# Patient Record
Sex: Female | Born: 1950 | Race: White | Hispanic: No | Marital: Married | Smoking: Never smoker
Health system: Southern US, Community
[De-identification: ages and names within clinical notes are randomized; demographics above are authoritative.]

## PROBLEM LIST (undated history)

## (undated) DIAGNOSIS — M797 Fibromyalgia: Secondary | ICD-10-CM

## (undated) DIAGNOSIS — E785 Hyperlipidemia, unspecified: Secondary | ICD-10-CM

## (undated) DIAGNOSIS — E039 Hypothyroidism, unspecified: Secondary | ICD-10-CM

## (undated) DIAGNOSIS — K219 Gastro-esophageal reflux disease without esophagitis: Secondary | ICD-10-CM

## (undated) DIAGNOSIS — F411 Generalized anxiety disorder: Secondary | ICD-10-CM

## (undated) DIAGNOSIS — I10 Essential (primary) hypertension: Secondary | ICD-10-CM

## (undated) DIAGNOSIS — N183 Chronic kidney disease, stage 3 unspecified: Secondary | ICD-10-CM

## (undated) DIAGNOSIS — U099 Post covid-19 condition, unspecified: Secondary | ICD-10-CM

## (undated) HISTORY — DX: Chronic kidney disease, stage 3 unspecified: N18.30

## (undated) HISTORY — DX: Hypothyroidism, unspecified: E03.9

## (undated) HISTORY — DX: Hyperlipidemia, unspecified: E78.5

## (undated) HISTORY — DX: Essential (primary) hypertension: I10

## (undated) HISTORY — DX: Gastro-esophageal reflux disease without esophagitis: K21.9

## (undated) HISTORY — DX: Fibromyalgia: M79.7

## (undated) HISTORY — DX: Generalized anxiety disorder: F41.1

## (undated) HISTORY — PX: CATARACT EXTRACTION, BILATERAL: SHX1313

---

## 1998-05-09 HISTORY — PX: DILATION AND CURETTAGE OF UTERUS: SHX78

## 2014-02-05 ENCOUNTER — Telehealth: Payer: Self-pay | Admitting: *Deleted

## 2014-06-11 NOTE — Telephone Encounter (Signed)
Error

## 2017-10-11 DIAGNOSIS — E782 Mixed hyperlipidemia: Secondary | ICD-10-CM | POA: Diagnosis not present

## 2017-10-11 DIAGNOSIS — I10 Essential (primary) hypertension: Secondary | ICD-10-CM | POA: Diagnosis not present

## 2017-10-17 DIAGNOSIS — E782 Mixed hyperlipidemia: Secondary | ICD-10-CM | POA: Diagnosis not present

## 2017-10-17 DIAGNOSIS — Z1331 Encounter for screening for depression: Secondary | ICD-10-CM | POA: Diagnosis not present

## 2017-10-17 DIAGNOSIS — M549 Dorsalgia, unspecified: Secondary | ICD-10-CM | POA: Diagnosis not present

## 2017-10-17 DIAGNOSIS — I10 Essential (primary) hypertension: Secondary | ICD-10-CM | POA: Diagnosis not present

## 2017-10-17 DIAGNOSIS — N183 Chronic kidney disease, stage 3 (moderate): Secondary | ICD-10-CM | POA: Diagnosis not present

## 2017-10-17 DIAGNOSIS — Z1389 Encounter for screening for other disorder: Secondary | ICD-10-CM | POA: Diagnosis not present

## 2017-10-17 DIAGNOSIS — S29019A Strain of muscle and tendon of unspecified wall of thorax, initial encounter: Secondary | ICD-10-CM | POA: Diagnosis not present

## 2017-10-17 DIAGNOSIS — Z6836 Body mass index (BMI) 36.0-36.9, adult: Secondary | ICD-10-CM | POA: Diagnosis not present

## 2017-11-07 DIAGNOSIS — Z780 Asymptomatic menopausal state: Secondary | ICD-10-CM | POA: Diagnosis not present

## 2017-11-07 DIAGNOSIS — M810 Age-related osteoporosis without current pathological fracture: Secondary | ICD-10-CM | POA: Diagnosis not present

## 2017-11-07 DIAGNOSIS — M8588 Other specified disorders of bone density and structure, other site: Secondary | ICD-10-CM | POA: Diagnosis not present

## 2017-11-07 DIAGNOSIS — M4850XA Collapsed vertebra, not elsewhere classified, site unspecified, initial encounter for fracture: Secondary | ICD-10-CM | POA: Diagnosis not present

## 2018-03-13 DIAGNOSIS — H35033 Hypertensive retinopathy, bilateral: Secondary | ICD-10-CM | POA: Diagnosis not present

## 2018-03-19 DIAGNOSIS — J0101 Acute recurrent maxillary sinusitis: Secondary | ICD-10-CM | POA: Diagnosis not present

## 2018-03-19 DIAGNOSIS — R05 Cough: Secondary | ICD-10-CM | POA: Diagnosis not present

## 2018-03-19 DIAGNOSIS — Z6837 Body mass index (BMI) 37.0-37.9, adult: Secondary | ICD-10-CM | POA: Diagnosis not present

## 2018-04-11 DIAGNOSIS — E781 Pure hyperglyceridemia: Secondary | ICD-10-CM | POA: Diagnosis not present

## 2018-04-11 DIAGNOSIS — I10 Essential (primary) hypertension: Secondary | ICD-10-CM | POA: Diagnosis not present

## 2018-04-11 DIAGNOSIS — Z23 Encounter for immunization: Secondary | ICD-10-CM | POA: Diagnosis not present

## 2018-04-11 DIAGNOSIS — M797 Fibromyalgia: Secondary | ICD-10-CM | POA: Diagnosis not present

## 2018-04-11 DIAGNOSIS — E782 Mixed hyperlipidemia: Secondary | ICD-10-CM | POA: Diagnosis not present

## 2018-04-11 DIAGNOSIS — N183 Chronic kidney disease, stage 3 (moderate): Secondary | ICD-10-CM | POA: Diagnosis not present

## 2018-04-11 DIAGNOSIS — K210 Gastro-esophageal reflux disease with esophagitis: Secondary | ICD-10-CM | POA: Diagnosis not present

## 2018-04-17 DIAGNOSIS — S22000D Wedge compression fracture of unspecified thoracic vertebra, subsequent encounter for fracture with routine healing: Secondary | ICD-10-CM | POA: Diagnosis not present

## 2018-04-17 DIAGNOSIS — J069 Acute upper respiratory infection, unspecified: Secondary | ICD-10-CM | POA: Diagnosis not present

## 2018-04-17 DIAGNOSIS — Z6836 Body mass index (BMI) 36.0-36.9, adult: Secondary | ICD-10-CM | POA: Diagnosis not present

## 2018-04-17 DIAGNOSIS — I10 Essential (primary) hypertension: Secondary | ICD-10-CM | POA: Diagnosis not present

## 2018-04-17 DIAGNOSIS — K210 Gastro-esophageal reflux disease with esophagitis: Secondary | ICD-10-CM | POA: Diagnosis not present

## 2018-04-17 DIAGNOSIS — E782 Mixed hyperlipidemia: Secondary | ICD-10-CM | POA: Diagnosis not present

## 2018-04-17 DIAGNOSIS — Z23 Encounter for immunization: Secondary | ICD-10-CM | POA: Diagnosis not present

## 2018-04-17 DIAGNOSIS — N183 Chronic kidney disease, stage 3 (moderate): Secondary | ICD-10-CM | POA: Diagnosis not present

## 2018-07-16 DIAGNOSIS — H25013 Cortical age-related cataract, bilateral: Secondary | ICD-10-CM | POA: Diagnosis not present

## 2018-07-16 DIAGNOSIS — H25042 Posterior subcapsular polar age-related cataract, left eye: Secondary | ICD-10-CM | POA: Diagnosis not present

## 2018-07-16 DIAGNOSIS — H2512 Age-related nuclear cataract, left eye: Secondary | ICD-10-CM | POA: Diagnosis not present

## 2018-07-16 DIAGNOSIS — H35033 Hypertensive retinopathy, bilateral: Secondary | ICD-10-CM | POA: Diagnosis not present

## 2018-07-16 DIAGNOSIS — H25012 Cortical age-related cataract, left eye: Secondary | ICD-10-CM | POA: Diagnosis not present

## 2018-07-16 DIAGNOSIS — H2513 Age-related nuclear cataract, bilateral: Secondary | ICD-10-CM | POA: Diagnosis not present

## 2018-07-16 DIAGNOSIS — H25043 Posterior subcapsular polar age-related cataract, bilateral: Secondary | ICD-10-CM | POA: Diagnosis not present

## 2018-07-20 DIAGNOSIS — N183 Chronic kidney disease, stage 3 (moderate): Secondary | ICD-10-CM | POA: Diagnosis not present

## 2018-07-20 DIAGNOSIS — Z6838 Body mass index (BMI) 38.0-38.9, adult: Secondary | ICD-10-CM | POA: Diagnosis not present

## 2018-07-20 DIAGNOSIS — M797 Fibromyalgia: Secondary | ICD-10-CM | POA: Diagnosis not present

## 2018-07-20 DIAGNOSIS — I10 Essential (primary) hypertension: Secondary | ICD-10-CM | POA: Diagnosis not present

## 2018-07-20 DIAGNOSIS — E782 Mixed hyperlipidemia: Secondary | ICD-10-CM | POA: Diagnosis not present

## 2018-07-24 DIAGNOSIS — H25812 Combined forms of age-related cataract, left eye: Secondary | ICD-10-CM | POA: Diagnosis not present

## 2018-07-24 DIAGNOSIS — H2512 Age-related nuclear cataract, left eye: Secondary | ICD-10-CM | POA: Diagnosis not present

## 2018-08-02 DIAGNOSIS — H2512 Age-related nuclear cataract, left eye: Secondary | ICD-10-CM | POA: Diagnosis not present

## 2018-08-22 DIAGNOSIS — H2511 Age-related nuclear cataract, right eye: Secondary | ICD-10-CM | POA: Diagnosis not present

## 2018-08-22 DIAGNOSIS — H25041 Posterior subcapsular polar age-related cataract, right eye: Secondary | ICD-10-CM | POA: Diagnosis not present

## 2018-08-22 DIAGNOSIS — H25011 Cortical age-related cataract, right eye: Secondary | ICD-10-CM | POA: Diagnosis not present

## 2018-09-25 DIAGNOSIS — H25011 Cortical age-related cataract, right eye: Secondary | ICD-10-CM | POA: Diagnosis not present

## 2018-09-25 DIAGNOSIS — H2511 Age-related nuclear cataract, right eye: Secondary | ICD-10-CM | POA: Diagnosis not present

## 2018-09-25 DIAGNOSIS — H25041 Posterior subcapsular polar age-related cataract, right eye: Secondary | ICD-10-CM | POA: Diagnosis not present

## 2018-10-08 DIAGNOSIS — E782 Mixed hyperlipidemia: Secondary | ICD-10-CM | POA: Diagnosis not present

## 2018-10-08 DIAGNOSIS — R5383 Other fatigue: Secondary | ICD-10-CM | POA: Diagnosis not present

## 2018-10-08 DIAGNOSIS — N183 Chronic kidney disease, stage 3 (moderate): Secondary | ICD-10-CM | POA: Diagnosis not present

## 2018-10-08 DIAGNOSIS — I10 Essential (primary) hypertension: Secondary | ICD-10-CM | POA: Diagnosis not present

## 2018-10-08 DIAGNOSIS — K210 Gastro-esophageal reflux disease with esophagitis: Secondary | ICD-10-CM | POA: Diagnosis not present

## 2018-10-08 DIAGNOSIS — E781 Pure hyperglyceridemia: Secondary | ICD-10-CM | POA: Diagnosis not present

## 2018-10-17 DIAGNOSIS — M797 Fibromyalgia: Secondary | ICD-10-CM | POA: Diagnosis not present

## 2018-10-17 DIAGNOSIS — E782 Mixed hyperlipidemia: Secondary | ICD-10-CM | POA: Diagnosis not present

## 2018-10-17 DIAGNOSIS — I10 Essential (primary) hypertension: Secondary | ICD-10-CM | POA: Diagnosis not present

## 2018-10-17 DIAGNOSIS — G44209 Tension-type headache, unspecified, not intractable: Secondary | ICD-10-CM | POA: Diagnosis not present

## 2018-10-17 DIAGNOSIS — S22000D Wedge compression fracture of unspecified thoracic vertebra, subsequent encounter for fracture with routine healing: Secondary | ICD-10-CM | POA: Diagnosis not present

## 2018-10-17 DIAGNOSIS — N183 Chronic kidney disease, stage 3 (moderate): Secondary | ICD-10-CM | POA: Diagnosis not present

## 2018-10-17 DIAGNOSIS — Z6838 Body mass index (BMI) 38.0-38.9, adult: Secondary | ICD-10-CM | POA: Diagnosis not present

## 2018-10-17 DIAGNOSIS — Z0001 Encounter for general adult medical examination with abnormal findings: Secondary | ICD-10-CM | POA: Diagnosis not present

## 2019-02-11 DIAGNOSIS — Z23 Encounter for immunization: Secondary | ICD-10-CM | POA: Diagnosis not present

## 2019-04-11 DIAGNOSIS — I10 Essential (primary) hypertension: Secondary | ICD-10-CM | POA: Diagnosis not present

## 2019-04-11 DIAGNOSIS — E781 Pure hyperglyceridemia: Secondary | ICD-10-CM | POA: Diagnosis not present

## 2019-04-11 DIAGNOSIS — E782 Mixed hyperlipidemia: Secondary | ICD-10-CM | POA: Diagnosis not present

## 2019-04-11 DIAGNOSIS — R5383 Other fatigue: Secondary | ICD-10-CM | POA: Diagnosis not present

## 2019-04-17 DIAGNOSIS — N183 Chronic kidney disease, stage 3 unspecified: Secondary | ICD-10-CM | POA: Diagnosis not present

## 2019-04-17 DIAGNOSIS — E039 Hypothyroidism, unspecified: Secondary | ICD-10-CM | POA: Diagnosis not present

## 2019-04-17 DIAGNOSIS — Z6838 Body mass index (BMI) 38.0-38.9, adult: Secondary | ICD-10-CM | POA: Diagnosis not present

## 2019-04-17 DIAGNOSIS — F411 Generalized anxiety disorder: Secondary | ICD-10-CM | POA: Diagnosis not present

## 2019-04-17 DIAGNOSIS — Z23 Encounter for immunization: Secondary | ICD-10-CM | POA: Diagnosis not present

## 2019-04-17 DIAGNOSIS — I10 Essential (primary) hypertension: Secondary | ICD-10-CM | POA: Diagnosis not present

## 2019-04-17 DIAGNOSIS — G44209 Tension-type headache, unspecified, not intractable: Secondary | ICD-10-CM | POA: Diagnosis not present

## 2019-04-17 DIAGNOSIS — S22000D Wedge compression fracture of unspecified thoracic vertebra, subsequent encounter for fracture with routine healing: Secondary | ICD-10-CM | POA: Diagnosis not present

## 2019-05-07 DIAGNOSIS — U071 COVID-19: Secondary | ICD-10-CM | POA: Diagnosis not present

## 2019-07-31 DIAGNOSIS — Z23 Encounter for immunization: Secondary | ICD-10-CM | POA: Diagnosis not present

## 2019-08-28 DIAGNOSIS — Z23 Encounter for immunization: Secondary | ICD-10-CM | POA: Diagnosis not present

## 2019-10-08 DIAGNOSIS — E039 Hypothyroidism, unspecified: Secondary | ICD-10-CM | POA: Diagnosis not present

## 2019-10-08 DIAGNOSIS — E7800 Pure hypercholesterolemia, unspecified: Secondary | ICD-10-CM | POA: Diagnosis not present

## 2019-10-08 DIAGNOSIS — K2100 Gastro-esophageal reflux disease with esophagitis, without bleeding: Secondary | ICD-10-CM | POA: Diagnosis not present

## 2019-10-08 DIAGNOSIS — N183 Chronic kidney disease, stage 3 unspecified: Secondary | ICD-10-CM | POA: Diagnosis not present

## 2019-10-08 DIAGNOSIS — I10 Essential (primary) hypertension: Secondary | ICD-10-CM | POA: Diagnosis not present

## 2019-10-10 DIAGNOSIS — E782 Mixed hyperlipidemia: Secondary | ICD-10-CM | POA: Diagnosis not present

## 2019-10-10 DIAGNOSIS — E039 Hypothyroidism, unspecified: Secondary | ICD-10-CM | POA: Diagnosis not present

## 2019-10-10 DIAGNOSIS — Z0001 Encounter for general adult medical examination with abnormal findings: Secondary | ICD-10-CM | POA: Diagnosis not present

## 2019-10-10 DIAGNOSIS — M797 Fibromyalgia: Secondary | ICD-10-CM | POA: Diagnosis not present

## 2019-10-10 DIAGNOSIS — N183 Chronic kidney disease, stage 3 unspecified: Secondary | ICD-10-CM | POA: Diagnosis not present

## 2019-10-10 DIAGNOSIS — I10 Essential (primary) hypertension: Secondary | ICD-10-CM | POA: Diagnosis not present

## 2019-10-15 DIAGNOSIS — R230 Cyanosis: Secondary | ICD-10-CM | POA: Diagnosis not present

## 2019-10-15 DIAGNOSIS — I10 Essential (primary) hypertension: Secondary | ICD-10-CM | POA: Diagnosis not present

## 2019-10-15 DIAGNOSIS — I517 Cardiomegaly: Secondary | ICD-10-CM | POA: Diagnosis not present

## 2019-10-15 DIAGNOSIS — G44209 Tension-type headache, unspecified, not intractable: Secondary | ICD-10-CM | POA: Diagnosis not present

## 2019-10-15 DIAGNOSIS — E782 Mixed hyperlipidemia: Secondary | ICD-10-CM | POA: Diagnosis not present

## 2019-10-15 DIAGNOSIS — S22000D Wedge compression fracture of unspecified thoracic vertebra, subsequent encounter for fracture with routine healing: Secondary | ICD-10-CM | POA: Diagnosis not present

## 2019-10-15 DIAGNOSIS — Z6837 Body mass index (BMI) 37.0-37.9, adult: Secondary | ICD-10-CM | POA: Diagnosis not present

## 2019-10-18 DIAGNOSIS — Z1231 Encounter for screening mammogram for malignant neoplasm of breast: Secondary | ICD-10-CM | POA: Diagnosis not present

## 2019-11-15 ENCOUNTER — Ambulatory Visit: Payer: Self-pay | Admitting: Cardiology

## 2019-11-20 ENCOUNTER — Encounter: Payer: Self-pay | Admitting: *Deleted

## 2019-12-11 ENCOUNTER — Ambulatory Visit: Payer: Self-pay | Admitting: Cardiology

## 2020-02-25 DIAGNOSIS — H35033 Hypertensive retinopathy, bilateral: Secondary | ICD-10-CM | POA: Diagnosis not present

## 2020-02-25 DIAGNOSIS — Z23 Encounter for immunization: Secondary | ICD-10-CM | POA: Diagnosis not present

## 2020-03-04 DIAGNOSIS — J40 Bronchitis, not specified as acute or chronic: Secondary | ICD-10-CM | POA: Diagnosis not present

## 2020-03-04 DIAGNOSIS — Z20828 Contact with and (suspected) exposure to other viral communicable diseases: Secondary | ICD-10-CM | POA: Diagnosis not present

## 2020-03-04 DIAGNOSIS — J0101 Acute recurrent maxillary sinusitis: Secondary | ICD-10-CM | POA: Diagnosis not present

## 2020-03-04 DIAGNOSIS — J069 Acute upper respiratory infection, unspecified: Secondary | ICD-10-CM | POA: Diagnosis not present

## 2020-03-23 DIAGNOSIS — H04123 Dry eye syndrome of bilateral lacrimal glands: Secondary | ICD-10-CM | POA: Diagnosis not present

## 2020-03-23 DIAGNOSIS — Z961 Presence of intraocular lens: Secondary | ICD-10-CM | POA: Diagnosis not present

## 2020-03-23 DIAGNOSIS — H26492 Other secondary cataract, left eye: Secondary | ICD-10-CM | POA: Diagnosis not present

## 2020-03-23 DIAGNOSIS — H35033 Hypertensive retinopathy, bilateral: Secondary | ICD-10-CM | POA: Diagnosis not present

## 2020-03-23 DIAGNOSIS — H26493 Other secondary cataract, bilateral: Secondary | ICD-10-CM | POA: Diagnosis not present

## 2020-04-07 DIAGNOSIS — E039 Hypothyroidism, unspecified: Secondary | ICD-10-CM | POA: Diagnosis not present

## 2020-04-07 DIAGNOSIS — K2100 Gastro-esophageal reflux disease with esophagitis, without bleeding: Secondary | ICD-10-CM | POA: Diagnosis not present

## 2020-04-07 DIAGNOSIS — E7800 Pure hypercholesterolemia, unspecified: Secondary | ICD-10-CM | POA: Diagnosis not present

## 2020-04-07 DIAGNOSIS — N183 Chronic kidney disease, stage 3 unspecified: Secondary | ICD-10-CM | POA: Diagnosis not present

## 2020-04-07 DIAGNOSIS — I10 Essential (primary) hypertension: Secondary | ICD-10-CM | POA: Diagnosis not present

## 2020-04-07 DIAGNOSIS — E782 Mixed hyperlipidemia: Secondary | ICD-10-CM | POA: Diagnosis not present

## 2020-04-14 DIAGNOSIS — S22000D Wedge compression fracture of unspecified thoracic vertebra, subsequent encounter for fracture with routine healing: Secondary | ICD-10-CM | POA: Diagnosis not present

## 2020-04-14 DIAGNOSIS — R230 Cyanosis: Secondary | ICD-10-CM | POA: Diagnosis not present

## 2020-04-14 DIAGNOSIS — Z6836 Body mass index (BMI) 36.0-36.9, adult: Secondary | ICD-10-CM | POA: Diagnosis not present

## 2020-04-14 DIAGNOSIS — G44209 Tension-type headache, unspecified, not intractable: Secondary | ICD-10-CM | POA: Diagnosis not present

## 2020-04-14 DIAGNOSIS — I10 Essential (primary) hypertension: Secondary | ICD-10-CM | POA: Diagnosis not present

## 2020-04-14 DIAGNOSIS — E782 Mixed hyperlipidemia: Secondary | ICD-10-CM | POA: Diagnosis not present

## 2020-04-14 DIAGNOSIS — M810 Age-related osteoporosis without current pathological fracture: Secondary | ICD-10-CM | POA: Diagnosis not present

## 2020-04-20 DIAGNOSIS — H26491 Other secondary cataract, right eye: Secondary | ICD-10-CM | POA: Diagnosis not present

## 2020-04-21 DIAGNOSIS — Z23 Encounter for immunization: Secondary | ICD-10-CM | POA: Diagnosis not present

## 2020-05-20 DIAGNOSIS — N183 Chronic kidney disease, stage 3 unspecified: Secondary | ICD-10-CM | POA: Diagnosis not present

## 2020-05-20 DIAGNOSIS — Z6836 Body mass index (BMI) 36.0-36.9, adult: Secondary | ICD-10-CM | POA: Diagnosis not present

## 2020-05-20 DIAGNOSIS — R5383 Other fatigue: Secondary | ICD-10-CM | POA: Diagnosis not present

## 2020-05-20 DIAGNOSIS — I10 Essential (primary) hypertension: Secondary | ICD-10-CM | POA: Diagnosis not present

## 2020-06-22 ENCOUNTER — Ambulatory Visit (INDEPENDENT_AMBULATORY_CARE_PROVIDER_SITE_OTHER): Payer: Medicare Other | Admitting: Cardiology

## 2020-06-22 ENCOUNTER — Encounter: Payer: Self-pay | Admitting: Cardiology

## 2020-06-22 ENCOUNTER — Encounter: Payer: Self-pay | Admitting: *Deleted

## 2020-06-22 VITALS — BP 122/92 | HR 75 | Ht 60.0 in | Wt 197.0 lb

## 2020-06-22 DIAGNOSIS — E782 Mixed hyperlipidemia: Secondary | ICD-10-CM | POA: Diagnosis not present

## 2020-06-22 DIAGNOSIS — Z8616 Personal history of COVID-19: Secondary | ICD-10-CM | POA: Diagnosis not present

## 2020-06-22 DIAGNOSIS — R0602 Shortness of breath: Secondary | ICD-10-CM | POA: Diagnosis not present

## 2020-06-22 DIAGNOSIS — R9431 Abnormal electrocardiogram [ECG] [EKG]: Secondary | ICD-10-CM

## 2020-06-22 DIAGNOSIS — I10 Essential (primary) hypertension: Secondary | ICD-10-CM

## 2020-06-22 DIAGNOSIS — R0609 Other forms of dyspnea: Secondary | ICD-10-CM

## 2020-06-22 DIAGNOSIS — R0600 Dyspnea, unspecified: Secondary | ICD-10-CM

## 2020-06-22 NOTE — Progress Notes (Signed)
Cardiology Office Note  Date: 06/22/2020   ID: Megan, Mcmahon 1951/03/16, MRN [id number]  PCP:  Estanislado Pandy, MD  Cardiologist:  Nona Dell, MD Electrophysiologist:  None   Chief Complaint  Patient presents with  . Shortness of Breath    History of Present Illness: Megan Mcmahon is a 70 y.o. female referred for cardiology consultation by Dr. Neita Carp for the evaluation of shortness of breath.  She is here today with her husband.  She tells me that she had COVID-19 in December 2020 (tested positive) and most likely also again in December 2021 (did not take a test at that time).  Most recent event included 2 weeks of shortness of breath and cough, malaise, 15 pound weight loss, fingertips turning "blue."  She was not hospitalized.  Her husband states that he did not get sick.  Since that event she has continued to be short of breath with activity.  Also lack of energy.  I reviewed her most recent lab work per PCP from November 2021.  She has not had a recent chest x-ray.  I reviewed her medications which are listed below.  She did apparently have a spike in blood pressure during the most recent illness in December 2021, took an extra half Toprol-XL during that time.  Blood pressure has come back down.  I personally reviewed her ECG today which shows normal sinus rhythm with rightward axis, septal Q waves.  She has not undergone any recent cardiac structural or ischemic testing, but does recall evaluation several years ago with [facility]. She has no personal history of type 2 diabetes mellitus, does take medication for both hypertension and hyperlipidemia.  Past Medical History:  Diagnosis Date  . Chronic kidney disease, stage 3 (HCC)   . Fibromyalgia   . GAD (generalized anxiety disorder)   . GERD (gastroesophageal reflux disease)   . Hyperlipidemia   . Hypertension   . Hypothyroidism     Past Surgical History:  Procedure Laterality Date  . CATARACT  EXTRACTION, BILATERAL    . DILATION AND CURETTAGE OF UTERUS  2000    Current Outpatient Medications  Medication Sig Dispense Refill  . alendronate (FOSAMAX) 70 MG tablet Take 70 mg by mouth once a week. Take with a full glass of water on an empty stomach.    . ALPRAZolam (XANAX) 0.5 MG tablet Take 0.5 mg by mouth 3 (three) times daily as needed for anxiety.    Marland Kitchen amitriptyline (ELAVIL) 50 MG tablet Take 50 mg by mouth at bedtime as needed for sleep.    . DULoxetine (CYMBALTA) 60 MG capsule Take 60 mg by mouth daily.    Marland Kitchen escitalopram (LEXAPRO) 10 MG tablet Take 10 mg by mouth daily.    . hydrochlorothiazide (HYDRODIURIL) 25 MG tablet Take 25 mg by mouth daily.    Marland Kitchen levothyroxine (SYNTHROID) 50 MCG tablet Take 50 mcg by mouth daily before breakfast.    . metoprolol succinate (TOPROL-XL) 50 MG 24 hr tablet Take 75 mg by mouth daily. Take with or immediately following a meal.    . pravastatin (PRAVACHOL) 20 MG tablet Take 20 mg by mouth daily.    . pregabalin (LYRICA) 75 MG capsule Take 75 mg by mouth 2 (two) times daily.    Marland Kitchen tolterodine (DETROL LA) 4 MG 24 hr capsule Take 4 mg by mouth daily.     No current facility-administered medications for this visit.   Allergies:  Codeine   Social History:  The patient  reports that she has never smoked. She has never used smokeless tobacco. She reports that she does not drink alcohol and does not use drugs.   Family History: The patient's family history includes Heart attack in her father; Stroke in her mother.   ROS: No palpitations or syncope.  Physical Exam: VS:  BP (!) 122/92   Pulse 75   Ht 5' (1.524 m)   Wt 197 lb (89.4 kg)   BMI 38.47 kg/m , BMI Body mass index is 38.47 kg/m.  Wt Readings from Last 3 Encounters:  06/22/20 197 lb (89.4 kg)    General: Patient appears comfortable at rest. HEENT: Conjunctiva and lids normal, wearing a mask. Neck: Supple, no elevated JVP or carotid bruits, no thyromegaly. Lungs: Anterior crackles, no  wheezes or labored breathing at rest. Cardiac: Regular rate and rhythm, no S3, soft systolic murmur, no pericardial rub. Abdomen: Soft, nontender, bowel sounds present. Extremities: No pitting edema, distal pulses 2+. Skin: Warm and dry. Musculoskeletal: No kyphosis. Neuropsychiatric: Alert and oriented x3, affect grossly appropriate.  ECG:  No old tracings for review today.  Recent Labwork:  November 2021: BUN 8, creatinine 0.93, potassium 4.2, AST 18, ALT 11, cholesterol 175, TG 273, HDL 37, LDL 92, TSH 3.8  Other Studies Reviewed Today:  No prior cardiac testing for review today.  Assessment and Plan:  1.  Dyspnea on exertion as discussed above in a 70 year old woman with hypertension and hyperlipidemia.  ECG with nonspecific abnormalities, no known history of ischemic heart disease or cardiomyopathy.  She had suspected COVID-19 in December 2020 as well as December 2021.  No recent chest imaging.  I did review her lab work from November 2021.  Plan is to obtain a PA and lateral chest x-ray, also echocardiogram for cardiac structural assessment, and Lexiscan Myoview for ischemic evaluation.  2.  Essential hypertension, currently on Toprol-XL and HCTZ.  She follows with Dr. Neita Carp.  3.  Mixed hyperlipidemia, on Pravachol.  Most recent LDL 92.  Medication Adjustments/Labs and Tests Ordered: Current medicines are reviewed at length with the patient today.  Concerns regarding medicines are outlined above.   Tests Ordered: Orders Placed This Encounter  Procedures  . DG Chest 2 View  . NM Myocar Multi W/Spect W/Wall Motion / EF  . EKG 12-Lead  . ECHOCARDIOGRAM COMPLETE    Medication Changes: No orders of the defined types were placed in this encounter.   Disposition:  Follow up test results.  Signed, Jonelle Sidle, MD, [facility] 06/22/2020 3:29 PM    [facility] at [facility] [address] [place], [place], [place] [address] Phone: [phone]; Fax: [phone]

## 2020-06-22 NOTE — Patient Instructions (Signed)
Your physician recommends that you schedule a follow-up appointment in: PENDING WITH DR MCDOWELL  Your physician recommends that you continue on your current medications as directed. Please refer to the Current Medication list given to you today.  A chest x-ray takes a picture of the organs and structures inside the chest, including the heart, lungs, and blood vessels. This test can show several things, including, whether the heart is enlarges; whether fluid is building up in the lungs; and whether pacemaker / defibrillator leads are still in place.  Your physician has requested that you have an echocardiogram. Echocardiography is a painless test that uses sound waves to create images of your heart. It provides your doctor with information about the size and shape of your heart and how well your heart's chambers and valves are working. This procedure takes approximately one hour. There are no restrictions for this procedure.  Your physician has requested that you have a lexiscan myoview. For further information please visit [email or web address] Please follow instruction sheet, as given.  Thank you for choosing [facility]!!

## 2020-06-24 ENCOUNTER — Ambulatory Visit (HOSPITAL_COMMUNITY)
Admission: RE | Admit: 2020-06-24 | Discharge: 2020-06-24 | Disposition: A | Discharge status: Home / Self Care | Payer: Medicare Other | Source: Ambulatory Visit | Attending: Cardiology | Admitting: Cardiology

## 2020-06-24 ENCOUNTER — Other Ambulatory Visit: Payer: Self-pay

## 2020-06-24 DIAGNOSIS — R0602 Shortness of breath: Secondary | ICD-10-CM | POA: Diagnosis not present

## 2020-06-24 DIAGNOSIS — R0600 Dyspnea, unspecified: Secondary | ICD-10-CM | POA: Diagnosis not present

## 2020-07-01 ENCOUNTER — Telehealth: Payer: Self-pay | Admitting: *Deleted

## 2020-07-01 DIAGNOSIS — R0602 Shortness of breath: Secondary | ICD-10-CM

## 2020-07-01 NOTE — Telephone Encounter (Signed)
-----   Message from Jonelle Sidle, MD sent at 06/29/2020  4:28 PM EST ----- Results reviewed.  Chest x-ray does show abnormalities in the mid to lower lung zones, possibly consistent with resolving infection from COVID-19 in December, however fibrotic changes are also possible. Please refer to [facility] for further evaluation of lung findings.  She has had no recent imaging through her PCP.

## 2020-07-01 NOTE — Telephone Encounter (Signed)
Patient informed and verbalized understanding of plan. Copy sent to PCP 

## 2020-07-02 ENCOUNTER — Other Ambulatory Visit: Payer: Self-pay

## 2020-07-02 ENCOUNTER — Encounter (HOSPITAL_COMMUNITY): Payer: Self-pay

## 2020-07-02 ENCOUNTER — Encounter (HOSPITAL_BASED_OUTPATIENT_CLINIC_OR_DEPARTMENT_OTHER)
Admission: RE | Admit: 2020-07-02 | Discharge: 2020-07-02 | Disposition: A | Discharge status: Home / Self Care | Payer: Medicare Other | Source: Ambulatory Visit | Attending: Cardiology | Admitting: Cardiology

## 2020-07-02 ENCOUNTER — Ambulatory Visit (HOSPITAL_COMMUNITY)
Admission: RE | Admit: 2020-07-02 | Discharge: 2020-07-02 | Disposition: A | Discharge status: Home / Self Care | Payer: Medicare Other | Source: Ambulatory Visit | Attending: Cardiology | Admitting: Cardiology

## 2020-07-02 ENCOUNTER — Telehealth: Payer: Self-pay | Admitting: *Deleted

## 2020-07-02 ENCOUNTER — Encounter (HOSPITAL_COMMUNITY)
Admission: RE | Admit: 2020-07-02 | Discharge: 2020-07-02 | Disposition: A | Discharge status: Home / Self Care | Payer: Medicare Other | Source: Ambulatory Visit | Attending: Cardiology | Admitting: Cardiology

## 2020-07-02 DIAGNOSIS — R0602 Shortness of breath: Secondary | ICD-10-CM | POA: Diagnosis not present

## 2020-07-02 DIAGNOSIS — R0609 Other forms of dyspnea: Secondary | ICD-10-CM

## 2020-07-02 DIAGNOSIS — R0600 Dyspnea, unspecified: Secondary | ICD-10-CM | POA: Diagnosis not present

## 2020-07-02 LAB — ECHOCARDIOGRAM COMPLETE
Area-P 1/2: 3.25 cm2
S' Lateral: 2.4 cm

## 2020-07-02 LAB — NM MYOCAR MULTI W/SPECT W/WALL MOTION / EF
LV dias vol: 63 mL (ref 46–106)
LV sys vol: 16 mL
Peak HR: 91 {beats}/min
RATE: 0.58
Rest HR: 82 {beats}/min
SDS: 2
SRS: 5
SSS: 7
TID: 1.43

## 2020-07-02 MED ORDER — REGADENOSON 0.4 MG/5ML IV SOLN
INTRAVENOUS | Status: AC
  Administered 2020-07-02: 0.4 mg via INTRAVENOUS
  Filled 2020-07-02: qty 5

## 2020-07-02 MED ORDER — TECHNETIUM TC 99M TETROFOSMIN IV KIT
30.0000 | PACK | Freq: Once | INTRAVENOUS | Status: AC | PRN
  Administered 2020-07-02: 30.3 via INTRAVENOUS

## 2020-07-02 MED ORDER — TECHNETIUM TC 99M TETROFOSMIN IV KIT
10.0000 | PACK | Freq: Once | INTRAVENOUS | Status: AC | PRN
  Administered 2020-07-02: 9.9 via INTRAVENOUS

## 2020-07-02 MED ORDER — SODIUM CHLORIDE FLUSH 0.9 % IV SOLN
INTRAVENOUS | Status: AC
  Administered 2020-07-02: 10 mL via INTRAVENOUS
  Filled 2020-07-02: qty 10

## 2020-07-02 NOTE — Telephone Encounter (Signed)
Pt voiced understanding

## 2020-07-02 NOTE — Telephone Encounter (Signed)
-----   Message from Jonelle Sidle, MD sent at 07/02/2020 12:39 PM EST ----- Results reviewed.  Echocardiogram shows vigorous LVEF at 65 to 70%, also normal RV contraction.  No major valvular abnormalities that would explain symptoms.

## 2020-07-02 NOTE — Progress Notes (Signed)
*  PRELIMINARY RESULTS* Echocardiogram 2D Echocardiogram has been performed.  Stacey Drain 07/02/2020, 11:24 AM

## 2020-07-08 ENCOUNTER — Telehealth: Payer: Self-pay | Admitting: Cardiology

## 2020-07-08 ENCOUNTER — Telehealth: Payer: Self-pay | Admitting: *Deleted

## 2020-07-08 NOTE — Telephone Encounter (Signed)
-----   Message from Jonelle Sidle, MD sent at 07/02/2020  4:18 PM EST ----- Results reviewed.  Stress test overall low risk, vigorous LVEF and no obvious ischemic territories to suggest obstructive CAD as cause of symptoms.  Keep plans for follow-up with Pulmonary.  No further cardiac testing at this time.

## 2020-07-08 NOTE — Telephone Encounter (Signed)
Advised that her f/u is pending test results and once all test have been reviewed by her provider-it will be determined at that time about her follow up. Verbalized understanding.

## 2020-07-08 NOTE — Telephone Encounter (Signed)
Patient called requesting if Dr. Diona Browner needs to see her after her recent tests.

## 2020-07-08 NOTE — Telephone Encounter (Signed)
Patient informed. Copy sent to PCP °

## 2020-07-13 ENCOUNTER — Institutional Professional Consult (permissible substitution): Payer: Medicare Other | Admitting: Internal Medicine

## 2020-07-16 ENCOUNTER — Encounter: Payer: Self-pay | Admitting: Internal Medicine

## 2020-07-16 ENCOUNTER — Other Ambulatory Visit (HOSPITAL_COMMUNITY)
Admission: RE | Admit: 2020-07-16 | Discharge: 2020-07-16 | Disposition: A | Discharge status: Home / Self Care | Payer: Medicare Other | Source: Ambulatory Visit | Attending: Internal Medicine | Admitting: Internal Medicine

## 2020-07-16 ENCOUNTER — Ambulatory Visit (INDEPENDENT_AMBULATORY_CARE_PROVIDER_SITE_OTHER): Payer: Medicare Other | Admitting: Internal Medicine

## 2020-07-16 ENCOUNTER — Ambulatory Visit (HOSPITAL_COMMUNITY)
Admission: RE | Admit: 2020-07-16 | Discharge: 2020-07-16 | Disposition: A | Discharge status: Home / Self Care | Payer: Medicare Other | Source: Ambulatory Visit | Attending: Internal Medicine | Admitting: Internal Medicine

## 2020-07-16 ENCOUNTER — Other Ambulatory Visit: Payer: Self-pay

## 2020-07-16 VITALS — BP 134/84 | HR 77 | Temp 97.8°F | Ht 60.0 in | Wt 195.0 lb

## 2020-07-16 DIAGNOSIS — R0600 Dyspnea, unspecified: Secondary | ICD-10-CM

## 2020-07-16 DIAGNOSIS — J9612 Chronic respiratory failure with hypercapnia: Secondary | ICD-10-CM

## 2020-07-16 DIAGNOSIS — R0609 Other forms of dyspnea: Secondary | ICD-10-CM

## 2020-07-16 DIAGNOSIS — R058 Other specified cough: Secondary | ICD-10-CM | POA: Diagnosis not present

## 2020-07-16 DIAGNOSIS — J9811 Atelectasis: Secondary | ICD-10-CM | POA: Diagnosis not present

## 2020-07-16 DIAGNOSIS — J9611 Chronic respiratory failure with hypoxia: Secondary | ICD-10-CM | POA: Diagnosis not present

## 2020-07-16 LAB — CBC WITH DIFFERENTIAL/PLATELET
Abs Immature Granulocytes: 0.03 10*3/uL (ref 0.00–0.07)
Basophils Absolute: 0 10*3/uL (ref 0.0–0.1)
Basophils Relative: 1 %
Eosinophils Absolute: 0.1 10*3/uL (ref 0.0–0.5)
Eosinophils Relative: 1 %
HCT: 56.9 % — ABNORMAL HIGH (ref 36.0–46.0)
Hemoglobin: 17 g/dL — ABNORMAL HIGH (ref 12.0–15.0)
Immature Granulocytes: 1 %
Lymphocytes Relative: 13 %
Lymphs Abs: 0.8 10*3/uL (ref 0.7–4.0)
MCH: 26.8 pg (ref 26.0–34.0)
MCHC: 29.9 g/dL — ABNORMAL LOW (ref 30.0–36.0)
MCV: 89.7 fL (ref 80.0–100.0)
Monocytes Absolute: 0.6 10*3/uL (ref 0.1–1.0)
Monocytes Relative: 8 %
Neutro Abs: 5.1 10*3/uL (ref 1.7–7.7)
Neutrophils Relative %: 76 %
Platelets: 202 10*3/uL (ref 150–400)
RBC: 6.34 MIL/uL — ABNORMAL HIGH (ref 3.87–5.11)
RDW: 19.8 % — ABNORMAL HIGH (ref 11.5–15.5)
WBC: 6.7 10*3/uL (ref 4.0–10.5)
nRBC: 0 % (ref 0.0–0.2)

## 2020-07-16 LAB — BASIC METABOLIC PANEL
Anion gap: 11 (ref 5–15)
BUN: 11 mg/dL (ref 8–23)
CO2: 30 mmol/L (ref 22–32)
Calcium: 9.5 mg/dL (ref 8.9–10.3)
Chloride: 95 mmol/L — ABNORMAL LOW (ref 98–111)
Creatinine, Ser: 0.8 mg/dL (ref 0.44–1.00)
GFR, Estimated: >60 mL/min (ref >60)
Glucose, Bld: 81 mg/dL (ref 70–99)
Potassium: 4.4 mmol/L (ref 3.5–5.1)
Sodium: 136 mmol/L (ref 135–145)

## 2020-07-16 LAB — TSH: TSH: 1.798 u[IU]/mL (ref 0.350–4.500)

## 2020-07-16 LAB — D-DIMER, QUANTITATIVE: D-Dimer, Quant: 0.65 ug/mL-FEU — ABNORMAL HIGH (ref 0.00–0.50)

## 2020-07-16 LAB — BRAIN NATRIURETIC PEPTIDE: B Natriuretic Peptide: 242 pg/mL — ABNORMAL HIGH (ref 0.0–100.0)

## 2020-07-16 LAB — SEDIMENTATION RATE: Sed Rate: 1 mm/hr (ref 0–22)

## 2020-07-16 MED ORDER — FAMOTIDINE 20 MG PO TABS: ORAL_TABLET | ORAL | 11 refills | Status: DC

## 2020-07-16 MED ORDER — PANTOPRAZOLE SODIUM 40 MG PO TBEC: 40.0000 mg | DELAYED_RELEASE_TABLET | Freq: Every day | ORAL | 2 refills | Status: DC

## 2020-07-16 NOTE — Progress Notes (Addendum)
Megan Mcmahon, female    DOB: [birth year]   MRN: [id number]   Brief patient profile:  89 yowf never smoker with baseline 165-170 noted doe x 2002 when noted could not keep up with husband then covid x 10 Apr 2019 and Jan 2022 > never admitted but much worse p second episode so referred to pulmonary clinic in [place]  07/16/2020 by Dr  Gracy Racer with [facility] though Echo ok 07/02/20     History of Present Illness  07/16/2020  Pulmonary/ 1st office eval/ [facility] [place] [facility]  Chief Complaint  Patient presents with  . Pulmonary Consult    Referred by Dr Diona Browner. Pt c/o SOB for the past year.  She gets winded doing light housework. She also c/o non prod cough.   Dyspnea:  MMRC1 = can walk nl pace, flat grade, can't hurry or go uphills or steps s sob   Cough: min  Dry  Day > noct  Sleep: can't sleep on back even before covid now sleeping on side one side and bed is flat  SABA use:  Few years back with bronchitis seemed to help none since.  02 : has it, not using  No obvious day to day or daytime variability or assoc excess/ purulent sputum or mucus plugs or hemoptysis or cp or chest tightness, subjective wheeze or overt sinus or hb symptoms.   Sleeping as above  without nocturnal  or early am exacerbation  of respiratory  c/o's or need for noct saba. Also denies any obvious fluctuation of symptoms with weather or environmental changes or other aggravating or alleviating factors except as outlined above   No unusual exposure hx or h/o childhood pna/ asthma or knowledge of premature birth.  Current Allergies, Complete Past Medical History, Past Surgical History, Family History, and Social History were reviewed in [employer] record.  ROS  The following are not active complaints unless bolded Hoarseness, sore throat, dysphagia= globus , dental problems, itching, sneezing,  nasal congestion or discharge of excess mucus or purulent secretions, ear ache,    fever, chills, sweats, unintended wt loss or wt gain, classically pleuritic or exertional cp,  orthopnea pnd or arm/hand swelling  or leg swelling, presyncope, palpitations, abdominal pain, anorexia, nausea, vomiting, diarrhea  or change in bowel habits or change in bladder habits, change in stools or change in urine, dysuria, hematuria,  rash, arthralgias, visual complaints, headache, numbness, weakness or ataxia or problems with walking or coordination,  change in mood or  memory.           Past Medical History:  Diagnosis Date  . Chronic kidney disease, stage 3 (HCC)   . Fibromyalgia   . GAD (generalized anxiety disorder)   . GERD (gastroesophageal reflux disease)   . Hyperlipidemia   . Hypertension   . Hypothyroidism     Outpatient Medications Prior to Visit  Medication Sig Dispense Refill  . alendronate (FOSAMAX) 70 MG tablet Take 70 mg by mouth once a week. Take with a full glass of water on an empty stomach.    . ALPRAZolam (XANAX) 0.5 MG tablet Take 0.5 mg by mouth 3 (three) times daily as needed for anxiety.    Marland Kitchen amitriptyline (ELAVIL) 50 MG tablet Take 50 mg by mouth at bedtime as needed for sleep.    . DULoxetine (CYMBALTA) 60 MG capsule Take 60 mg by mouth daily.    Marland Kitchen escitalopram (LEXAPRO) 10 MG tablet Take 10 mg by mouth  daily.    . hydrochlorothiazide (HYDRODIURIL) 25 MG tablet Take 25 mg by mouth daily.    Marland Kitchen levothyroxine (SYNTHROID) 50 MCG tablet Take 50 mcg by mouth daily before breakfast.    . metoprolol succinate (TOPROL-XL) 50 MG 24 hr tablet Take 75 mg by mouth daily. Take with or immediately following a meal.    . pravastatin (PRAVACHOL) 20 MG tablet Take 20 mg by mouth daily.    . pregabalin (LYRICA) 75 MG capsule Take 75 mg by mouth 2 (two) times daily.    Marland Kitchen tolterodine (DETROL LA) 4 MG 24 hr capsule Take 4 mg by mouth daily.     No facility-administered medications prior to visit.     Objective:     BP 134/84 (BP Location: Left Arm, Cuff Size: Normal)    Pulse 77   Temp 97.8 F (36.6 C) (Temporal)   Ht 5' (1.524 m)   Wt 195 lb (88.5 kg)   SpO2 96% Comment: 2lpm cont o2  BMI 38.08 kg/m   SpO2: 96 % (2lpm cont o2) O2 Type: Continuous O2 O2 Flow Rate (L/min): 2 L/min   Obese pleasant wf classic raynaud's   HEENT : pt wearing mask not removed for exam due to covid -19 concerns.    NECK :  without JVD/Nodes/TM/ nl carotid upstrokes bilaterally   LUNGS: no acc muscle use,  Nl contour chest with insp crackles in R base  without cough on insp or exp maneuvers   CV:  RRR  no s3 or murmur or increase in P2, and no edema   ABD:  soft and nontender with nl inspiratory excursion in the supine position. No bruits or organomegaly appreciated, bowel sounds nl  MS:  Nl gait/ ext warm without deformities, calf tenderness, cyanosis or clubbing No obvious joint restrictions   SKIN: warm and dry without lesions    NEURO:  alert, approp, nl sensorium with  no motor or cerebellar deficits apparent.    Labs ordered/ reviewed:      Chemistry      Component Value Date/Time   NA 136 07/16/2020 1601   K 4.4 07/16/2020 1601   CL 95 (L) 07/16/2020 1601   CO2 30 07/16/2020 1601   BUN 11 07/16/2020 1601   CREATININE 0.80 07/16/2020 1601      Component Value Date/Time   CALCIUM 9.5 07/16/2020 1601        Lab Results  Component Value Date   WBC 6.7 07/16/2020   HGB 17.0 (H) 07/16/2020   HCT 56.9 (H) 07/16/2020   MCV 89.7 07/16/2020   PLT 202 07/16/2020       EOS                                                               0.1                                     07/16/2020   Lab Results  Component Value Date   DDIMER 0.65 (H) 07/16/2020      Lab Results  Component Value Date   TSH 1.798 07/16/2020     BNP    07/16/20      242  Lab Results  Component Value Date   ESRSEDRATE 1 07/16/2020       Scleroderma Antibody 07/16/2020   = neg      Assessment   DOE (dyspnea on exertion) Onset around 2002 then covid Dec 2020  and Jan 2021 neither required admit  - Echo ok 07/02/20 ok  - 02 rx started 07/16/2020  (see separate a/p)  - marked elevation of R HD 07/16/2020  ? Chronicity   Although I was certain this was scleroderma, no evidence of any issue but raynaud's at this point and given mild polycycthemia the main challenge here is keeping sats > 90% to see if improve ex tol / conditioning and follow serially to look for other mechanisms for ex intol while awaiting sniff test.     Chronic respiratory failure with hypoxia and hypercapnia (HCC) Onset of sob 2002 assoc with elevated R HD ? Chronicity - HCO3  = 30  07/16/2020  - 07/16/2020 after desat 88% on 2lpm sat x 100 ft, o2 was increased to 3lpm and she walked an additional 276ft and sats at end 92%3lpm/pt c/o fatigue and min SOB  Goal is to keep sats > 90%  Advised; Make sure you check your oxygen saturation  at your highest level of activity  to be sure it stays over 90% and adjust  02 flow upward to maintain this level if needed but remember to turn it back to previous settings when you stop (to conserve your supply).     Upper airway cough syndrome Try off fosamax 07/16/2020   Upper airway cough syndrome (previously labeled PNDS),  is so named because it's frequently impossible to sort out how much is  CR/sinusitis with freq throat clearing (which can be related to primary GERD)   vs  causing  secondary (" extra esophageal")  GERD from wide swings in gastric pressure that occur with throat clearing, often  promoting self use of mint and menthol lozenges that reduce the lower esophageal sphincter tone and exacerbate the problem further in a cyclical fashion.   These are the same pts (now being labeled as having "irritable larynx syndrome" by some cough centers) who not infrequently have a history of having failed to tolerate ace inhibitors,  dry powder inhalers or biphosphonates or report having atypical/extraesophageal reflux symptoms that don't respond to  standard doses of PPI  and are easily confused as having aecopd or asthma flares by even experienced allergists/ pulmonologists (myself included).   >>> rec max rx for gerd/ off fosamax until return in 4-6 week with pfts   Each maintenance medication was reviewed in detail including emphasizing most importantly the difference between maintenance and prns and under what circumstances the prns are to be triggered using an action plan format where appropriate.  Total time for H and P, chart review, counseling,  directly observing portions of ambulatory 02 saturation study/ and generating customized AVS unique to this office visit / same day charting = 49 min                    Sandrea Hughs, MD 07/16/2020

## 2020-07-16 NOTE — Patient Instructions (Addendum)
Stop fosamax   Pantoprazole (protonix) 40 mg   Take  30-60 min before first meal of the day and Pepcid (famotidine)  20 mg one after supper  until return to office - this is the best way to tell whether stomach acid is contributing to your problem.     Please remember to go to the lab and x-ray department at [facility]   for your tests - we will call you with the results when they are available.       Please schedule a follow up office visit in 4-6 weeks, call sooner if needed with PFTs on return

## 2020-07-17 ENCOUNTER — Encounter: Payer: Self-pay | Admitting: Internal Medicine

## 2020-07-17 DIAGNOSIS — J9612 Chronic respiratory failure with hypercapnia: Secondary | ICD-10-CM | POA: Insufficient documentation

## 2020-07-17 DIAGNOSIS — R058 Other specified cough: Secondary | ICD-10-CM | POA: Insufficient documentation

## 2020-07-17 DIAGNOSIS — J9611 Chronic respiratory failure with hypoxia: Secondary | ICD-10-CM | POA: Insufficient documentation

## 2020-07-17 LAB — SCLERODERMA DIAGNOSTIC PROFILE
Anti Nuclear Antibody (ANA): NEGATIVE
Scleroderma (Scl-70) (ENA) Antibody, IgG: 0.2 AI (ref 0.0–0.9)

## 2020-07-17 NOTE — Assessment & Plan Note (Signed)
Try off fosamax 07/16/2020   Upper airway cough syndrome (previously labeled PNDS),  is so named because it's frequently impossible to sort out how much is  CR/sinusitis with freq throat clearing (which can be related to primary GERD)   vs  causing  secondary (" extra esophageal")  GERD from wide swings in gastric pressure that occur with throat clearing, often  promoting self use of mint and menthol lozenges that reduce the lower esophageal sphincter tone and exacerbate the problem further in a cyclical fashion.   These are the same pts (now being labeled as having "irritable larynx syndrome" by some cough centers) who not infrequently have a history of having failed to tolerate ace inhibitors,  dry powder inhalers or biphosphonates or report having atypical/extraesophageal reflux symptoms that don't respond to standard doses of PPI  and are easily confused as having aecopd or asthma flares by even experienced allergists/ pulmonologists (myself included).   >>> rec max rx for gerd/ off fosamax until return in 4-6 week with pfts   Each maintenance medication was reviewed in detail including emphasizing most importantly the difference between maintenance and prns and under what circumstances the prns are to be triggered using an action plan format where appropriate.  Total time for H and P, chart review, counseling,  directly observing portions of ambulatory 02 saturation study/ and generating customized AVS unique to this office visit / same day charting = 49 min

## 2020-07-17 NOTE — Assessment & Plan Note (Signed)
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 Chronicity - HCO3  = 30  07/16/2020  - 07/16/2020 after desat 88% on 2lpm sat x 100 ft, o2 was increased to 3lpm and she walked an additional 272ft and sats at end 92%3lpm/pt c/o fatigue and min SOB  Goal is to keep sats > 90%  Advised; Make sure you check your oxygen saturation  at your highest level of activity  to be sure it stays over 90% and adjust  02 flow upward to maintain this level if needed but remember to turn it back to previous settings when you stop (to conserve your supply).

## 2020-07-17 NOTE — Assessment & Plan Note (Signed)
Onset around 2002 then covid Dec 2020 and Jan 2021 neither required admit  - Echo ok 07/02/20 ok  - 02 rx started 07/16/2020  (see separate a/p)  - marked elevation of R HD 07/16/2020  ? Chronicity   Although I was certain this was scleroderma, no evidence of any issue but raynaud's at this point and given mild polycycthemia the main challenge here is keeping sats > 90% to see if improve ex tol / conditioning and follow serially to look for other mechanisms for ex intol while awaiting sniff test.

## 2020-07-20 ENCOUNTER — Other Ambulatory Visit: Payer: Self-pay | Admitting: Internal Medicine

## 2020-07-20 DIAGNOSIS — R0609 Other forms of dyspnea: Secondary | ICD-10-CM

## 2020-07-20 DIAGNOSIS — R0600 Dyspnea, unspecified: Secondary | ICD-10-CM

## 2020-07-20 NOTE — Progress Notes (Signed)
Spoke with pt and notified of results per Dr. Sherene Sires. Pt verbalized understanding and denied any questions. Pt agreeable to sniff test and I have ordered this.

## 2020-07-27 ENCOUNTER — Ambulatory Visit (HOSPITAL_COMMUNITY)
Admission: RE | Admit: 2020-07-27 | Discharge: 2020-07-27 | Disposition: A | Discharge status: Home / Self Care | Payer: Medicare Other | Source: Ambulatory Visit | Attending: Internal Medicine | Admitting: Internal Medicine

## 2020-07-27 ENCOUNTER — Other Ambulatory Visit: Payer: Self-pay | Admitting: Internal Medicine

## 2020-07-27 DIAGNOSIS — R0600 Dyspnea, unspecified: Secondary | ICD-10-CM | POA: Insufficient documentation

## 2020-07-27 DIAGNOSIS — U099 Post covid-19 condition, unspecified: Secondary | ICD-10-CM | POA: Diagnosis not present

## 2020-07-27 DIAGNOSIS — R0609 Other forms of dyspnea: Secondary | ICD-10-CM

## 2020-07-27 DIAGNOSIS — R0602 Shortness of breath: Secondary | ICD-10-CM | POA: Diagnosis not present

## 2020-07-27 DIAGNOSIS — R0789 Other chest pain: Secondary | ICD-10-CM | POA: Diagnosis not present

## 2020-07-27 NOTE — Progress Notes (Signed)
LMTCB

## 2020-08-04 ENCOUNTER — Other Ambulatory Visit (HOSPITAL_COMMUNITY)
Admission: RE | Admit: 2020-08-04 | Discharge: 2020-08-04 | Disposition: A | Discharge status: Home / Self Care | Payer: Medicare Other | Source: Ambulatory Visit | Attending: Internal Medicine | Admitting: Internal Medicine

## 2020-08-04 ENCOUNTER — Other Ambulatory Visit: Payer: Self-pay

## 2020-08-04 DIAGNOSIS — Z01812 Encounter for preprocedural laboratory examination: Secondary | ICD-10-CM | POA: Diagnosis not present

## 2020-08-04 DIAGNOSIS — Z20822 Contact with and (suspected) exposure to covid-19: Secondary | ICD-10-CM | POA: Insufficient documentation

## 2020-08-04 LAB — SARS CORONAVIRUS 2 (TAT 6-24 HRS): SARS Coronavirus 2: NEGATIVE

## 2020-08-05 ENCOUNTER — Encounter: Payer: Self-pay | Admitting: *Deleted

## 2020-08-05 ENCOUNTER — Ambulatory Visit (HOSPITAL_COMMUNITY)
Admission: RE | Admit: 2020-08-05 | Discharge: 2020-08-05 | Disposition: A | Discharge status: Home / Self Care | Payer: Medicare Other | Source: Ambulatory Visit | Attending: Internal Medicine | Admitting: Internal Medicine

## 2020-08-05 DIAGNOSIS — R0600 Dyspnea, unspecified: Secondary | ICD-10-CM | POA: Insufficient documentation

## 2020-08-05 DIAGNOSIS — R0609 Other forms of dyspnea: Secondary | ICD-10-CM

## 2020-08-05 LAB — PULMONARY FUNCTION TEST
DL/VA % pred: 97 %
DL/VA: 4.19 ml/min/mmHg/L
DLCO cor % pred: 43 %
DLCO cor: 7.52 ml/min/mmHg
DLCO unc % pred: 48 %
DLCO unc: 8.24 ml/min/mmHg
FEF 25-75 Post: 0.97 L/sec
FEF 25-75 Pre: 1.15 L/sec
FEF2575-%Change-Post: -15 %
FEF2575-%Pred-Post: 53 %
FEF2575-%Pred-Pre: 63 %
FEV1-%Change-Post: -3 %
FEV1-%Pred-Post: 44 %
FEV1-%Pred-Pre: 45 %
FEV1-Post: 0.88 L
FEV1-Pre: 0.91 L
FEV1FVC-%Change-Post: 4 %
FEV1FVC-%Pred-Pre: 112 %
FEV6-%Change-Post: -5 %
FEV6-%Pred-Post: 38 %
FEV6-%Pred-Pre: 41 %
FEV6-Post: 0.97 L
FEV6-Pre: 1.03 L
FEV6FVC-%Change-Post: 1 %
FEV6FVC-%Pred-Post: 104 %
FEV6FVC-%Pred-Pre: 102 %
FVC-%Change-Post: -7 %
FVC-%Pred-Post: 37 %
FVC-%Pred-Pre: 40 %
FVC-Post: 0.97 L
FVC-Pre: 1.06 L
Post FEV1/FVC ratio: 90 %
Post FEV6/FVC ratio: 100 %
Pre FEV1/FVC ratio: 86 %
Pre FEV6/FVC Ratio: 98 %

## 2020-08-05 MED ORDER — ALBUTEROL SULFATE (2.5 MG/3ML) 0.083% IN NEBU
2.5000 mg | INHALATION_SOLUTION | Freq: Once | RESPIRATORY_TRACT | Status: AC
  Administered 2020-08-05: 2.5 mg via RESPIRATORY_TRACT

## 2020-08-07 NOTE — Progress Notes (Signed)
Called and went over PFT results per Dr Sherene Sires with patient. All questions answered and patient expressed full understanding. Scheduled follow up office visit for Tuesday 08/11/2020 at 2:30pm at the [place] office. Patient agreeable to time, date and location.

## 2020-08-11 ENCOUNTER — Ambulatory Visit (INDEPENDENT_AMBULATORY_CARE_PROVIDER_SITE_OTHER): Payer: Medicare Other | Admitting: Internal Medicine

## 2020-08-11 ENCOUNTER — Other Ambulatory Visit: Payer: Self-pay

## 2020-08-11 ENCOUNTER — Encounter: Payer: Self-pay | Admitting: Internal Medicine

## 2020-08-11 DIAGNOSIS — R0609 Other forms of dyspnea: Secondary | ICD-10-CM

## 2020-08-11 DIAGNOSIS — R0600 Dyspnea, unspecified: Secondary | ICD-10-CM | POA: Diagnosis not present

## 2020-08-11 DIAGNOSIS — J9611 Chronic respiratory failure with hypoxia: Secondary | ICD-10-CM | POA: Diagnosis not present

## 2020-08-11 DIAGNOSIS — J9612 Chronic respiratory failure with hypercapnia: Secondary | ICD-10-CM | POA: Diagnosis not present

## 2020-08-11 DIAGNOSIS — R058 Other specified cough: Secondary | ICD-10-CM

## 2020-08-11 NOTE — Assessment & Plan Note (Signed)
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 Chronicity probably at least sinc 2019  - HCO3  = 30  07/16/2020  - 07/16/2020 after desat 88% on 2lpm sat x 100 ft, o2 was increased to 3lpm and she walked an additional 250ft and sats at end 92%3lpm/pt c/o fatigue and min SOB  Again advised: Make sure you check your oxygen saturation  at your highest level of activity  to be sure it stays over 90% and adjust  02 flow upward to maintain this level if needed but remember to turn it back to previous settings when you stop (to conserve your supply).

## 2020-08-11 NOTE — Assessment & Plan Note (Signed)
Try off fosamax 07/16/2020 and max gerd rx > improved 08/11/2020   >>> no change rx x 3 m          Each maintenance medication was reviewed in detail including emphasizing most importantly the difference between maintenance and prns and under what circumstances the prns are to be triggered using an action plan format where appropriate.  Total time for H and P, chart review, counseling re studies , reviewing 02  device(s) and generating customized AVS unique to this office visit / same day charting > 30 min

## 2020-08-11 NOTE — Progress Notes (Signed)
Megan Mcmahon, female    DOB: Jan 20, 1951   MRN: [id number]   Brief patient profile:  75 yowf never smoker with baseline 165-170 noted doe x 2002 when noted could not keep up with husband then covid x 10 Apr 2019 and Jan 2022 > never admitted but much worse p second episode so referred to pulmonary clinic in [place]  07/16/2020 by Megan Mcmahon with ? [facility] though Echo ok 07/02/20     History of Present Illness  07/16/2020  Pulmonary/ 1st office eval/ Megan Mcmahon / [place] [facility]  Chief Complaint  Patient presents with  . Pulmonary Consult    Referred by Megan Megan Mcmahon. Pt c/o SOB for the past year.  She gets winded doing light housework. She also c/o non prod cough.   Dyspnea:  MMRC1 = can walk nl pace, flat grade, can't hurry or go uphills or steps s sob   Cough: min  Dry  Day > noct  Sleep: can't sleep on back even before covid now sleeping on side one side and bed is flat  SABA use:  Few years back with bronchitis seemed to help none since.  02 : has it, not using rec Stop fosamax  Pantoprazole (protonix) 40 mg   Take  30-60 min before first meal of the day and Pepcid (famotidine)  20 mg one after supper  until return to office - this is the best way to tell whether stomach acid is contributing to your problem.        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 Follow-up    Breathing has improved some since the last visit when started on supplemental o2. She has noticed occ wheezing.   Dyspnea:  Megan Mcmahon is as much as she's done, walking around on 3lpm did not check sats as rec  Cough: none  Sleeping: one pillow / flat bed / no cough noct  SABA use: none  02: 3lpm 24/7  Covid status vax x 3 x infection   Lung cancer screening: never smoker    No obvious day to day or daytime variability or assoc  excess/ purulent sputum or mucus plugs or hemoptysis or cp or chest tightness, subjective wheeze or overt sinus or hb symptoms.   sleeping as abov without nocturnal  or early am exacerbation  of respiratory  c/o's or need for noct saba. Also denies any obvious fluctuation of symptoms with weather or environmental changes or other aggravating or alleviating factors except as outlined above   No unusual exposure hx or h/o childhood pna/ asthma or knowledge of premature birth.  Current Allergies, Complete Past Medical History, Past Surgical History, Family History, and Social History were reviewed in Megan Mcmahon record.  ROS  The following are not active complaints unless bolded Hoarseness, sore throat, dysphagia, dental problems, itching, sneezing,  nasal congestion or discharge of excess mucus or purulent secretions, ear ache,   fever, chills, sweats, unintended wt loss or wt gain, classically pleuritic or exertional cp,  orthopnea pnd or arm/hand swelling  or leg swelling, presyncope, palpitations, abdominal pain, anorexia, nausea, vomiting, diarrhea  or change in bowel habits or change in bladder habits, change in stools or change in urine, dysuria, hematuria,  rash, arthralgias, visual complaints, headache, numbness, weakness or ataxia or problems  with walking or coordination,  change in mood or  memory.        Current Meds  Medication Sig  . ALPRAZolam (XANAX) 0.5 MG tablet Take 0.5 mg by mouth 3 (three) times daily as needed for anxiety.  Marland Kitchen amitriptyline (ELAVIL) 50 MG tablet Take 50 mg by mouth at bedtime as needed for sleep.  . DULoxetine (CYMBALTA) 60 MG capsule Take 60 mg by mouth daily.  Marland Kitchen escitalopram (LEXAPRO) 10 MG tablet Take 10 mg by mouth daily.  . famotidine (PEPCID) 20 MG tablet One after supper  . hydrochlorothiazide (HYDRODIURIL) 25 MG tablet Take 25 mg by mouth daily.  Marland Kitchen levothyroxine (SYNTHROID) 50 MCG tablet Take 50 mcg by mouth daily before breakfast.   . metoprolol succinate (TOPROL-XL) 50 MG 24 hr tablet Take 75 mg by mouth daily. Take with or immediately following a meal.  . pantoprazole (PROTONIX) 40 MG tablet Take 1 tablet (40 mg total) by mouth daily. Take 30-60 min before first meal of the day  . pravastatin (PRAVACHOL) 20 MG tablet Take 20 mg by mouth daily.  . pregabalin (LYRICA) 75 MG capsule Take 75 mg by mouth 2 (two) times daily.  Marland Kitchen tolterodine (DETROL LA) 4 MG 24 hr capsule Take 4 mg by mouth daily.               Past Medical History:  Diagnosis Date  . Chronic kidney disease, stage 3 (HCC)   . Fibromyalgia   . GAD (generalized anxiety disorder)   . GERD (gastroesophageal reflux disease)   . Hyperlipidemia   . Hypertension   . Hypothyroidism          Objective:     Wt Readings from Last 3 Encounters:  08/11/20 199 lb 3.2 oz (90.4 kg)  07/16/20 195 lb (88.5 kg)  06/22/20 197 lb (89.4 kg)      Vital signs reviewed  08/11/2020  - Note at rest 02 sats  99% on 3lpm cont  And 93% at rest   General appearance:    amb obese wf nad        HEENT : pt wearing mask not removed for exam due to covid -19 concerns.    NECK :  without JVD/Nodes/TM/ nl carotid upstrokes bilaterally   LUNGS: no acc muscle use,  Nl contour chest which is clear to A and P bilaterally without cough on insp or exp maneuvers   CV:  RRR  no s3 or murmur or increase in P2, and no edema   ABD:  Obese soft and nontender with limited  inspiratory excursion.  No bruits or organomegaly appreciated, bowel sounds nl  MS:  Nl gait/ ext warm without deformities, calf tenderness, cyanosis or clubbing No obvious joint restrictions   SKIN: warm and dry without lesions    NEURO:  alert, approp, nl sensorium with  no motor or cerebellar deficits apparent.                    Assessment

## 2020-08-11 NOTE — Assessment & Plan Note (Signed)
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

## 2020-08-11 NOTE — Patient Instructions (Addendum)
Make sure you check your oxygen saturation  at your highest level of activity  to be sure it stays over 90% and adjust  02 flow upward to maintain this level if needed but remember to turn it back to previous settings when you stop (to conserve your supply).   To get the most out of exercise, you need to be continuously aware that you are short of breath, but never out of breath, for 30 minutes daily. As you improve, it will actually be easier for you to do the same amount of exercise  in  30 minutes so always push to the level where you are short of breath.      Please schedule a follow up visit in 3 months but call sooner if needed

## 2020-08-24 DIAGNOSIS — J9612 Chronic respiratory failure with hypercapnia: Secondary | ICD-10-CM

## 2020-08-24 DIAGNOSIS — J9611 Chronic respiratory failure with hypoxia: Secondary | ICD-10-CM

## 2020-10-06 ENCOUNTER — Other Ambulatory Visit: Payer: Self-pay | Admitting: Internal Medicine

## 2020-10-06 DIAGNOSIS — N183 Chronic kidney disease, stage 3 unspecified: Secondary | ICD-10-CM | POA: Diagnosis not present

## 2020-10-06 DIAGNOSIS — R5383 Other fatigue: Secondary | ICD-10-CM | POA: Diagnosis not present

## 2020-10-06 DIAGNOSIS — E039 Hypothyroidism, unspecified: Secondary | ICD-10-CM | POA: Diagnosis not present

## 2020-10-06 DIAGNOSIS — E7849 Other hyperlipidemia: Secondary | ICD-10-CM | POA: Diagnosis not present

## 2020-10-06 DIAGNOSIS — E782 Mixed hyperlipidemia: Secondary | ICD-10-CM | POA: Diagnosis not present

## 2020-10-06 MED ORDER — FAMOTIDINE 20 MG PO TABS: ORAL_TABLET | ORAL | 3 refills | Status: DC

## 2020-10-09 DIAGNOSIS — E039 Hypothyroidism, unspecified: Secondary | ICD-10-CM | POA: Diagnosis not present

## 2020-10-09 DIAGNOSIS — G44209 Tension-type headache, unspecified, not intractable: Secondary | ICD-10-CM | POA: Diagnosis not present

## 2020-10-09 DIAGNOSIS — M810 Age-related osteoporosis without current pathological fracture: Secondary | ICD-10-CM | POA: Diagnosis not present

## 2020-10-09 DIAGNOSIS — S22000D Wedge compression fracture of unspecified thoracic vertebra, subsequent encounter for fracture with routine healing: Secondary | ICD-10-CM | POA: Diagnosis not present

## 2020-10-09 DIAGNOSIS — E7849 Other hyperlipidemia: Secondary | ICD-10-CM | POA: Diagnosis not present

## 2020-10-09 DIAGNOSIS — K2100 Gastro-esophageal reflux disease with esophagitis, without bleeding: Secondary | ICD-10-CM | POA: Diagnosis not present

## 2020-10-09 DIAGNOSIS — I10 Essential (primary) hypertension: Secondary | ICD-10-CM | POA: Diagnosis not present

## 2020-11-18 ENCOUNTER — Encounter: Payer: Self-pay | Admitting: Internal Medicine

## 2020-11-18 ENCOUNTER — Ambulatory Visit (INDEPENDENT_AMBULATORY_CARE_PROVIDER_SITE_OTHER): Payer: Medicare Other | Admitting: Internal Medicine

## 2020-11-18 ENCOUNTER — Other Ambulatory Visit: Payer: Self-pay

## 2020-11-18 DIAGNOSIS — R0600 Dyspnea, unspecified: Secondary | ICD-10-CM

## 2020-11-18 DIAGNOSIS — R0609 Other forms of dyspnea: Secondary | ICD-10-CM

## 2020-11-18 DIAGNOSIS — J9611 Chronic respiratory failure with hypoxia: Secondary | ICD-10-CM | POA: Diagnosis not present

## 2020-11-18 DIAGNOSIS — J9612 Chronic respiratory failure with hypercapnia: Secondary | ICD-10-CM

## 2020-11-18 NOTE — Patient Instructions (Addendum)
Make sure you check your oxygen saturation  at your highest level of activity  to be sure it stays over 90% and adjust  02 flow upward to maintain this level if needed but remember to turn it back to previous settings when you stop (to conserve your supply).   If can't maintain over 90% with your desired level of activity, we can get adapt to best fit analyis for your portable system.  Please schedule a follow up visit in 3 months but call sooner if needed

## 2020-11-18 NOTE — Progress Notes (Signed)
Megan Mcmahon, female    DOB: December 30, 1950   MRN: [id number]   Brief patient profile:  63 yowf never smoker with baseline 165-170 noted doe x 2002 when noted could not keep up with husband then covid x 10 Apr 2019 and Jan 2022 > never admitted but much worse p second episode so referred to pulmonary clinic in [place]  07/16/2020 by Dr  Gracy Racer with ? [facility] though Echo ok 07/02/20     History of Present Illness  07/16/2020  Pulmonary/ 1st office eval/ Megan Mcmahon / [place] [facility]  Chief Complaint  Patient presents with   Pulmonary Consult    Referred by Dr Diona Browner. Pt c/o SOB for the past year.  She gets winded doing light housework. She also c/o non prod cough.   Dyspnea:  MMRC1 = can walk nl pace, flat grade, can't hurry or go uphills or steps s sob   Cough: min  Dry  Day > noct  Sleep: can't sleep on back even before covid now sleeping on side one side and bed is flat  SABA use:  Few years back with bronchitis seemed to help none since.  02 : has it, not using rec Stop fosamax  Pantoprazole (protonix) 40 mg   Take  30-60 min before first meal of the day and Pepcid (famotidine)  20 mg one after supper  until return to office - this is the best way to tell whether stomach acid is contributing to your problem.        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 She has noticed occ wheezing.   Dyspnea:  [name] is as much as she's done, walking around on 3lpm did not check sats as rec  Cough: none  Sleeping: one pillow / flat bed / no cough noct  SABA use: none  02: 3lpm 24/7  Covid status vax x 3 x infection   Lung cancer screening: never smoker  Rec Make sure you check your oxygen saturation  at your highest  level of activity  to be sure it stays over 90% and adjust  02 flow upward to maintain this level if needed but remember to turn it back to previous settings when you stop (to conserve your supply).  To get the most out of exercise, you need to be continuously aware that you are short of breath   11/18/2020  f/u ov/[place] office/Megan Mcmahon re: paresis R HD  Chief Complaint  Patient presents with   Follow-up    Breathing has improved some since the last visit. No new co's.   Dyspnea:  walking 1.5 hills 5 days per week on 3lpm but sats down to 70s on hills Cough: better  Sleeping: flat bed one pillow SABA use: none  02: 3lpm 24/7  Covid status: x 3 vax and infected x 2  Lung cancer screening: n/a    No obvious day to day or daytime variability or assoc excess/ purulent sputum or mucus plugs or hemoptysis or cp or chest tightness, subjective wheeze or overt sinus or hb symptoms.   Sleeping as above without nocturnal  or early am exacerbation  of respiratory  c/o's or need for noct saba. Also denies  any obvious fluctuation of symptoms with weather or environmental changes or other aggravating or alleviating factors except as outlined above   No unusual exposure hx or h/o childhood pna/ asthma or knowledge of premature birth.  Current Allergies, Complete Past Medical History, Past Surgical History, Family History, and Social History were reviewed in [employer] record.  ROS  The following are not active complaints unless bolded Hoarseness, sore throat, dysphagia, dental problems, itching, sneezing,  nasal congestion or discharge of excess mucus or purulent secretions, ear ache,   fever, chills, sweats, unintended wt loss or wt gain, classically pleuritic or exertional cp,  orthopnea pnd or arm/hand swelling  or leg swelling, presyncope, palpitations, abdominal pain, anorexia, nausea, vomiting, diarrhea  or change in bowel habits or change in bladder habits, change in stools  or change in urine, dysuria, hematuria,  rash, arthralgias, visual complaints, headache, numbness, weakness or ataxia or problems with walking or coordination,  change in mood or  memory.        Current Meds  Medication Sig   ALPRAZolam (XANAX) 0.5 MG tablet Take 0.5 mg by mouth 3 (three) times daily as needed for anxiety.   amitriptyline (ELAVIL) 50 MG tablet Take 25 mg by mouth at bedtime as needed for sleep.   DULoxetine (CYMBALTA) 60 MG capsule Take 60 mg by mouth daily.   escitalopram (LEXAPRO) 10 MG tablet Take 10 mg by mouth daily as needed.   famotidine (PEPCID) 20 MG tablet One after supper   hydrochlorothiazide (HYDRODIURIL) 25 MG tablet Take 25 mg by mouth daily.   levothyroxine (SYNTHROID) 50 MCG tablet Take 50 mcg by mouth daily before breakfast.   metoprolol succinate (TOPROL-XL) 50 MG 24 hr tablet Take 75 mg by mouth daily. Take with or immediately following a meal.   pantoprazole (PROTONIX) 40 MG tablet Take 1 tablet (40 mg total) by mouth daily. Take 30-60 min before first meal of the day   pravastatin (PRAVACHOL) 20 MG tablet Take 20 mg by mouth daily.   pregabalin (LYRICA) 75 MG capsule Take 75 mg by mouth 2 (two) times daily.   tolterodine (DETROL LA) 4 MG 24 hr capsule Take 4 mg by mouth daily.                   Past Medical History:  Diagnosis Date   Chronic kidney disease, stage 3 (HCC)    Fibromyalgia    GAD (generalized anxiety disorder)    GERD (gastroesophageal reflux disease)    Hyperlipidemia    Hypertension    Hypothyroidism          Objective:    11/18/2020       195   08/11/20 199 lb 3.2 oz (90.4 kg)  07/16/20 195 lb (88.5 kg)  06/22/20 197 lb (89.4 kg)      Vital signs reviewed  11/18/2020  - Note at rest 02 sats  100% on 3lpm    General appearance:    amb pleasant wf nad    HEENT : pt wearing mask not removed for exam due to covid -19 concerns.    NECK :  without JVD/Nodes/TM/ nl carotid upstrokes bilaterally   LUNGS: no acc  muscle use,  Nl contour chest a few crackles in bases bilaterally without cough on insp or exp maneuvers   CV:  RRR  no s3 or murmur or increase in P2, and no edema   ABD: obese  soft and nontender with nl inspiratory excursion in the  supine position. No bruits or organomegaly appreciated, bowel sounds nl  MS:  Nl gait/ ext warm without deformities, calf tenderness, cyanosis or clubbing No obvious joint restrictions   SKIN: warm and dry without lesions    NEURO:  alert, approp, nl sensorium with  no motor or cerebellar deficits apparent.                     Assessment

## 2020-11-19 ENCOUNTER — Encounter: Payer: Self-pay | Admitting: Internal Medicine

## 2020-11-19 NOTE — Assessment & Plan Note (Signed)
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 Chronicity probably at least since 2019 with paresis on SNIFF 07/27/20 - HCO3  = 30  07/16/2020  - 07/16/2020 after desat 88% on 2lpm sat x 100 ft, o2 was increased to 3lpm and she walked an additional 252ft and sats at end 92% 3lpm/pt c/o fatigue and min SOB  Again advised: Make sure you check your oxygen saturation  at your highest level of activity  to be sure it stays over 90% and adjust  02 flow upward to maintain this level if needed but remember to turn it back to previous settings when you stop (to conserve your supply).

## 2020-11-19 NOTE — Assessment & Plan Note (Addendum)
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  Total time for H and P, chart review, counseling, reviewing approp use of 02 device(s) and generating customized AVS unique to this office visit / same day charting = 

## 2020-11-24 DIAGNOSIS — Z20822 Contact with and (suspected) exposure to covid-19: Secondary | ICD-10-CM | POA: Diagnosis not present

## 2021-02-25 DIAGNOSIS — Z23 Encounter for immunization: Secondary | ICD-10-CM | POA: Diagnosis not present

## 2021-03-09 ENCOUNTER — Other Ambulatory Visit: Payer: Self-pay

## 2021-03-09 ENCOUNTER — Ambulatory Visit (INDEPENDENT_AMBULATORY_CARE_PROVIDER_SITE_OTHER): Payer: Medicare Other | Admitting: Internal Medicine

## 2021-03-09 ENCOUNTER — Ambulatory Visit (HOSPITAL_COMMUNITY)
Admission: RE | Admit: 2021-03-09 | Discharge: 2021-03-09 | Disposition: A | Discharge status: Home / Self Care | Payer: Medicare Other | Source: Ambulatory Visit | Attending: Internal Medicine | Admitting: Internal Medicine

## 2021-03-09 ENCOUNTER — Encounter: Payer: Self-pay | Admitting: Internal Medicine

## 2021-03-09 DIAGNOSIS — R0609 Other forms of dyspnea: Secondary | ICD-10-CM

## 2021-03-09 DIAGNOSIS — R0602 Shortness of breath: Secondary | ICD-10-CM | POA: Diagnosis not present

## 2021-03-09 DIAGNOSIS — J9612 Chronic respiratory failure with hypercapnia: Secondary | ICD-10-CM | POA: Insufficient documentation

## 2021-03-09 DIAGNOSIS — J9611 Chronic respiratory failure with hypoxia: Secondary | ICD-10-CM | POA: Diagnosis not present

## 2021-03-09 NOTE — Progress Notes (Signed)
LAVAYA DEFREITAS, female    DOB: 03-Feb-1951   MRN: [id number]   Brief patient profile:  22 yowf never smoker with baseline 165-170 noted doe x 2002 when noted could not keep up with husband then covid x 10 Apr 2019 and Jan 2022 > never admitted but much worse p second episode so referred to pulmonary clinic in [place]  07/16/2020 by Dr  Gracy Racer with ? [facility] though Echo ok 07/02/20     History of Present Illness  07/16/2020  Pulmonary/ 1st office eval/ Fraidy Mccarrick / [place] [facility]  Chief Complaint  Patient presents with   Pulmonary Consult    Referred by Dr Diona Browner. Pt c/o SOB for the past year.  She gets winded doing light housework. She also c/o non prod cough.   Dyspnea:  MMRC1 = can walk nl pace, flat grade, can't hurry or go uphills or steps s sob   Cough: min  Dry  Day > noct  Sleep: can't sleep on back even before covid now sleeping on side one side and bed is flat  SABA use:  Few years back with bronchitis seemed to help none since.  02 : has it, not using rec Stop fosamax  Pantoprazole (protonix) 40 mg   Take  30-60 min before first meal of the day and Pepcid (famotidine)  20 mg one after supper  until return to office - this is the best way to tell whether stomach acid is contributing to your problem.        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 She has noticed occ wheezing.   Dyspnea:  [name] is as much as she's done, walking around on 3lpm did not check sats as rec  Cough: none  Sleeping: one pillow / flat bed / no cough noct  SABA use: none  02: 3lpm 24/7  Covid status vax x 3 x infection   Lung cancer screening: never smoker  Rec Make sure you check your oxygen saturation  at your highest  level of activity  to be sure it stays over 90% and adjust  02 flow upward to maintain this level if needed but remember to turn it back to previous settings when you stop (to conserve your supply).  To get the most out of exercise, you need to be continuously aware that you are short of breath   11/18/2020  f/u ov/[place] office/Bushra Denman re: paresis R HD  Chief Complaint  Patient presents with   Follow-up    Breathing has improved some since the last visit. No new co's.   Dyspnea:  walking 1.5 hills 5 days per week on 3lpm but sats down to 70s on hills Cough: better  Sleeping: flat bed one pillow SABA use: none  02: 3lpm 24/7  Covid status: x 3 vax and infected x 2  Rec Make sure you check your oxygen saturation  at your highest level of activity  t  If can't maintain over 90% with your desired level of activity, we can get adapt to best fit analyis for your portable system.    03/09/2021  f/u ov/[place] office/Riggs Dineen re: paresis R HD, 02 dep    Chief Complaint  Patient  presents with   Follow-up    Patient reports no changes since last OV. On 3-4L O2 cont.   Dyspnea:  walking flat trail  stopping to check sats every 5 min / overall better activity vs baseline  Cough: none  Sleeping: flat bed one pillow SABA use: none  02: 3lpm sitting / sleeping  highest 4lpm continuous but is not really titrating as rec at highest level of activity as rec  Covid status: vax x 4      No obvious day to day or daytime variability or assoc excess/ purulent sputum or mucus plugs or hemoptysis or cp or chest tightness, subjective wheeze or overt sinus or hb symptoms.   Sleeping as above  without nocturnal  or early am exacerbation  of respiratory  c/o's or need for noct saba. Also denies any obvious fluctuation of symptoms with weather or environmental changes or other aggravating or alleviating factors except as outlined above   No unusual exposure hx or h/o childhood pna/ asthma or knowledge of  premature birth.  Current Allergies, Complete Past Medical History, Past Surgical History, Family History, and Social History were reviewed in [employer] record.  ROS  The following are not active complaints unless bolded Hoarseness, sore throat, dysphagia, dental problems, itching, sneezing,  nasal congestion or discharge of excess mucus or purulent secretions, ear ache,   fever, chills, sweats, unintended wt loss or wt gain, classically pleuritic or exertional cp,  orthopnea pnd or arm/hand swelling  or leg swelling, presyncope, palpitations, abdominal pain, anorexia, nausea, vomiting, diarrhea  or change in bowel habits or change in bladder habits, change in stools or change in urine, dysuria, hematuria,  rash, arthralgias, visual complaints, headache, numbness, weakness or ataxia or problems with walking or coordination,  change in mood or  memory.        Current Meds  Medication Sig   ALPRAZolam (XANAX) 0.5 MG tablet Take 0.5 mg by mouth 3 (three) times daily as needed for anxiety.   amitriptyline (ELAVIL) 50 MG tablet Take 25 mg by mouth at bedtime as needed for sleep.   DULoxetine (CYMBALTA) 60 MG capsule Take 60 mg by mouth daily.   escitalopram (LEXAPRO) 10 MG tablet Take 10 mg by mouth daily as needed.   famotidine (PEPCID) 20 MG tablet One after supper   hydrochlorothiazide (HYDRODIURIL) 25 MG tablet Take 25 mg by mouth daily.   levothyroxine (SYNTHROID) 50 MCG tablet Take 50 mcg by mouth daily before breakfast.   metoprolol succinate (TOPROL-XL) 50 MG 24 hr tablet Take 75 mg by mouth daily. Take with or immediately following a meal.   pantoprazole (PROTONIX) 40 MG tablet Take 1 tablet (40 mg total) by mouth daily. Take 30-60 min before first meal of the day   pravastatin (PRAVACHOL) 20 MG tablet Take 20 mg by mouth daily.   pregabalin (LYRICA) 75 MG capsule Take 75 mg by mouth 2 (two) times daily.   tolterodine (DETROL LA) 4 MG 24 hr capsule Take 4 mg by mouth  daily.                  Past Medical History:  Diagnosis Date   Chronic kidney disease, stage 3 (HCC)    Fibromyalgia    GAD (generalized anxiety disorder)    GERD (gastroesophageal reflux disease)    Hyperlipidemia    Hypertension    Hypothyroidism          Objective:    03/09/2021  198  11/18/2020       195   08/11/20 199 lb 3.2 oz (90.4 kg)  07/16/20 195 lb (88.5 kg)  06/22/20 197 lb (89.4 kg)    Vital signs reviewed  03/09/2021  - Note at rest 02 sats  97% on 4lpm cont   General appearance:   mod obese amb wf nad   HEENT : pt wearing mask not removed for exam due to covid -19 concerns.    NECK :  without JVD/Nodes/TM/ nl carotid upstrokes bilaterally   LUNGS: no acc muscle use,  Nl contour chest with decreased bs R > L base and insp crackles bases  bilaterally without cough on insp or exp maneuvers   CV:  RRR  no s3 or murmur or increase in P2, and no edema   ABD:  soft and nontender with nl inspiratory excursion in the supine position. No bruits or organomegaly appreciated, bowel sounds nl  MS:  Nl gait/ ext warm without deformities, calf tenderness, cyanosis or clubbing No obvious joint restrictions   SKIN: warm and dry without lesions    NEURO:  alert, approp, nl sensorium with  no motor or cerebellar deficits apparent.      CXR PA and Lateral:   03/09/2021 :    I personally reviewed images and   impression as follows:     No change elevated R HD with lots of intestinal gas below it           Assessment

## 2021-03-09 NOTE — Patient Instructions (Addendum)
I will refer you for best fit for portable 02 for adapt   To get the most out of exercise, you need to be continuously aware that you are short of breath, but never out of breath, for at least 30 minutes daily. As you improve, it will actually be easier for you to do the same amount of exercise  in  30 minutes so always push to the level where you are short of breath.  Once you can do this, push for longer duration or repeat it after at least 4 hours of rest.   Please schedule a follow up visit in 3 months but call sooner if needed   Please remember to go to the  x-ray department  @  [facility] for your tests - we will call you with the results when they are available      Please schedule a follow up visit in 3 months but call sooner if needed  Add CT chest with contrast re R HD /? Hernia ?

## 2021-03-10 ENCOUNTER — Encounter: Payer: Self-pay | Admitting: Internal Medicine

## 2021-03-10 NOTE — Assessment & Plan Note (Signed)
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 Whether has developed any form of hernia related to R HD > CT chest with contrast rec.

## 2021-03-10 NOTE — Assessment & Plan Note (Signed)
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   Will refer to [facility] for best fit for ambulatory 02          Each maintenance medication was reviewed in detail including emphasizing most importantly the difference between maintenance and prns and under what circumstances the prns are to be triggered using an action plan format where appropriate.  Total time for H and P, chart review, counseling, reviewing 02 device(s) and generating customized AVS unique to this office visit / same day charting =  30 min

## 2021-03-11 DIAGNOSIS — Z23 Encounter for immunization: Secondary | ICD-10-CM | POA: Diagnosis not present

## 2021-03-16 NOTE — Telephone Encounter (Signed)
-  Just have Rhonda call [place] at [facility] to explain what best fit means:  = provide the pt with best way to maintain sats > 90% at peak levels of exertion and fits their lifestyle the requirements best as well   If still doesn't understand what that means have her call me on my cell at [phone]

## 2021-03-16 NOTE — Telephone Encounter (Signed)
MW please advise. Thanks  Megan Mcmahon, Megan Mcmahon  P [facility] Received call from Rhonda at [facility] on Friday, she had received request for best fit , she has me scheduled  Nov 15 in [facility] to meet with technicians there. She said she has a cannister that would go to a level 5.  She then asked if that's what we wanted,  My question is  "does level five at highest level meet your best fit requirement?"

## 2021-03-23 NOTE — Telephone Encounter (Signed)
John, pt's husband, called while pt and spouse were at [facility] for best fit appt. The tech was unsure of what to do. Advised her of the recs per Dr. Sherene Sires. She verbalized understanding. Advised John to call back if there are any issues. He verbalized understanding and denied any further questions or concerns at this time.

## 2021-03-24 ENCOUNTER — Telehealth: Payer: Self-pay | Admitting: Internal Medicine

## 2021-03-24 NOTE — Telephone Encounter (Signed)
I have called and LM on VM for the pt to call back tomorrow after 8 am

## 2021-03-25 NOTE — Telephone Encounter (Signed)
[name], LPN; [place] The order I put in says Best Fit. I'm going to call and see where the confusion was. Sorry about this.

## 2021-03-25 NOTE — Telephone Encounter (Signed)
[name], LPN; [place] OK! I'll create a new order for her

## 2021-03-25 NOTE — Telephone Encounter (Signed)
Call made to patient, confirmed DOB. Patient states they went to an appt with [facility] for a best fit appt and they still walked away with nothing different. They walked her for a POC and she did not qualify. The patient's husband is stating the patient is using 3L at rest and 4L with ambulation.   Community message sent to [employer]. Will update message once we hear back from [place].

## 2021-04-05 ENCOUNTER — Telehealth: Payer: Self-pay | Admitting: Internal Medicine

## 2021-04-05 DIAGNOSIS — J9612 Chronic respiratory failure with hypercapnia: Secondary | ICD-10-CM

## 2021-04-05 NOTE — Telephone Encounter (Signed)
Spoke with the pt's spouse  He states [facility] needing order for regulator that will reach 6 lpm  Pt currently on 4-5lpm and feels she may have to use 6lpm if walking long distance  Order sent  Nothing further needed

## 2021-04-07 DIAGNOSIS — J40 Bronchitis, not specified as acute or chronic: Secondary | ICD-10-CM | POA: Diagnosis not present

## 2021-04-07 DIAGNOSIS — Z20828 Contact with and (suspected) exposure to other viral communicable diseases: Secondary | ICD-10-CM | POA: Diagnosis not present

## 2021-04-07 DIAGNOSIS — J329 Chronic sinusitis, unspecified: Secondary | ICD-10-CM | POA: Diagnosis not present

## 2021-04-12 ENCOUNTER — Telehealth: Payer: Self-pay | Admitting: Internal Medicine

## 2021-04-13 NOTE — Telephone Encounter (Signed)
Is this just an FYI????  There is no question asked in the msg, but the phone number given so I assume there was a question?  LMTCB for the pt's spouse

## 2021-04-14 NOTE — Telephone Encounter (Signed)
Noted  

## 2021-04-21 DIAGNOSIS — E7800 Pure hypercholesterolemia, unspecified: Secondary | ICD-10-CM | POA: Diagnosis not present

## 2021-04-21 DIAGNOSIS — N183 Chronic kidney disease, stage 3 unspecified: Secondary | ICD-10-CM | POA: Diagnosis not present

## 2021-04-21 DIAGNOSIS — K2100 Gastro-esophageal reflux disease with esophagitis, without bleeding: Secondary | ICD-10-CM | POA: Diagnosis not present

## 2021-04-21 DIAGNOSIS — E782 Mixed hyperlipidemia: Secondary | ICD-10-CM | POA: Diagnosis not present

## 2021-04-21 DIAGNOSIS — E7801 Familial hypercholesterolemia: Secondary | ICD-10-CM | POA: Diagnosis not present

## 2021-04-21 DIAGNOSIS — E7849 Other hyperlipidemia: Secondary | ICD-10-CM | POA: Diagnosis not present

## 2021-04-21 DIAGNOSIS — I10 Essential (primary) hypertension: Secondary | ICD-10-CM | POA: Diagnosis not present

## 2021-04-21 DIAGNOSIS — E039 Hypothyroidism, unspecified: Secondary | ICD-10-CM | POA: Diagnosis not present

## 2021-04-23 DIAGNOSIS — G44209 Tension-type headache, unspecified, not intractable: Secondary | ICD-10-CM | POA: Diagnosis not present

## 2021-04-23 DIAGNOSIS — E039 Hypothyroidism, unspecified: Secondary | ICD-10-CM | POA: Diagnosis not present

## 2021-04-23 DIAGNOSIS — S22000D Wedge compression fracture of unspecified thoracic vertebra, subsequent encounter for fracture with routine healing: Secondary | ICD-10-CM | POA: Diagnosis not present

## 2021-04-23 DIAGNOSIS — Z23 Encounter for immunization: Secondary | ICD-10-CM | POA: Diagnosis not present

## 2021-04-23 DIAGNOSIS — M546 Pain in thoracic spine: Secondary | ICD-10-CM | POA: Diagnosis not present

## 2021-04-23 DIAGNOSIS — I10 Essential (primary) hypertension: Secondary | ICD-10-CM | POA: Diagnosis not present

## 2021-04-23 DIAGNOSIS — E7849 Other hyperlipidemia: Secondary | ICD-10-CM | POA: Diagnosis not present

## 2021-05-19 ENCOUNTER — Telehealth: Payer: Self-pay | Admitting: Internal Medicine

## 2021-05-19 DIAGNOSIS — R0609 Other forms of dyspnea: Secondary | ICD-10-CM | POA: Diagnosis not present

## 2021-05-19 DIAGNOSIS — S22060A Wedge compression fracture of T7-T8 vertebra, initial encounter for closed fracture: Secondary | ICD-10-CM | POA: Diagnosis not present

## 2021-05-19 DIAGNOSIS — Z6835 Body mass index (BMI) 35.0-35.9, adult: Secondary | ICD-10-CM | POA: Diagnosis not present

## 2021-05-19 NOTE — Telephone Encounter (Signed)
Ok on calcitonin but hold off on alendronate until we regroup at ov in a couple of weeks as it is a very long term medication with no immediate benefit

## 2021-05-19 NOTE — Telephone Encounter (Signed)
Dr Sherene Sires Please Advise:    Patient would like to know if it's ok to take Calcitonin and if she should start back on Alendronate Sodium 7mg . Please advise

## 2021-05-19 NOTE — Telephone Encounter (Signed)
Called and spoke with pt's spouse Jonny Ruiz letting him know the info per Dr. Sherene Sires that it is okay for pt to resume taking the calcitonin but to hold off on the alendronate until follow up. John verbalized understanding. Nothing further needed.

## 2021-06-09 ENCOUNTER — Ambulatory Visit (INDEPENDENT_AMBULATORY_CARE_PROVIDER_SITE_OTHER): Payer: Medicare Other | Admitting: Internal Medicine

## 2021-06-09 ENCOUNTER — Encounter: Payer: Self-pay | Admitting: Internal Medicine

## 2021-06-09 ENCOUNTER — Other Ambulatory Visit: Payer: Self-pay

## 2021-06-09 DIAGNOSIS — R0609 Other forms of dyspnea: Secondary | ICD-10-CM | POA: Diagnosis not present

## 2021-06-09 DIAGNOSIS — J9611 Chronic respiratory failure with hypoxia: Secondary | ICD-10-CM

## 2021-06-09 DIAGNOSIS — J9612 Chronic respiratory failure with hypercapnia: Secondary | ICD-10-CM | POA: Diagnosis not present

## 2021-06-09 NOTE — Assessment & Plan Note (Addendum)
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 150 ft with lowest sats 92%   Advised  Make sure you check your oxygen saturation  AT  your highest level of activity (not after you stop)   to be sure it stays over 90% and adjust  02 flow upward to maintain this level if needed but remember to turn it back to previous settings when you stop (to conserve your supply).           Each maintenance medication was reviewed in detail including emphasizing most importantly the difference between maintenance and prns and under what circumstances the prns are to be triggered using an action plan format where appropriate.  Total time for H and P, chart review, counseling, reviewing 02  device(s) , directly observing portions of ambulatory 02 saturation study/ and generating customized AVS unique to this office visit / same day charting = 30 min

## 2021-06-09 NOTE — Assessment & Plan Note (Signed)
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 Element of ILD or just atx related to obesity and R HD dysfunction ?

## 2021-06-09 NOTE — Patient Instructions (Signed)
Make sure you check your oxygen saturation  AT  your highest level of activity (not after you stop)   to be sure it stays over 90% and adjust  02 flow upward to maintain this level if needed but remember to turn it back to previous settings when you stop (to conserve your supply).    We will schedule a high resolution CT and call results   Please schedule a follow up visit in  6 months but call sooner if needed

## 2021-06-09 NOTE — Progress Notes (Signed)
Megan Mcmahon, female    DOB: [birth year]   MRN: [id number]   Brief patient profile:  63 yowf never smoker with baseline 165-170 noted doe x 2002 when noted could not keep up with husband then covid x 10 Apr 2019 and Jan 2022 > never admitted but much worse p second episode so referred to pulmonary clinic in [place]  07/16/2020 by Dr  Gracy Racer with ? [facility] though Echo ok 07/02/20     History of Present Illness  07/16/2020  Pulmonary/ 1st office eval/ Megan Mcmahon / [place] [facility]  Chief Complaint  Patient presents with   Pulmonary Consult    Referred by Dr Diona Browner. Pt c/o SOB for the past year.  She gets winded doing light housework. She also c/o non prod cough.   Dyspnea:  MMRC1 = can walk nl pace, flat grade, can't hurry or go uphills or steps s sob   Cough: min  Dry  Day > noct  Sleep: can't sleep on back even before covid now sleeping on side one side and bed is flat  SABA use:  Few years back with bronchitis seemed to help none since.  02 : has it, not using rec Stop fosamax  Pantoprazole (protonix) 40 mg   Take  30-60 min before first meal of the day and Pepcid (famotidine)  20 mg one after supper  until return to office - this is the best way to tell whether stomach acid is contributing to your problem.        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 She has noticed occ wheezing.   Dyspnea:  [name] is as much as she's done, walking around on 3lpm did not check sats as rec  Cough: none  Sleeping: one pillow / flat bed / no cough noct  SABA use: none  02: 3lpm 24/7  Covid status vax x 3 x infection   Lung cancer screening: never smoker  Rec Make sure you check your oxygen saturation  at your highest  level of activity  to be sure it stays over 90% and adjust  02 flow upward to maintain this level if needed but remember to turn it back to previous settings when you stop (to conserve your supply).  To get the most out of exercise, you need to be continuously aware that you are short of breath   11/18/2020  f/u ov/[place] office/Megan Mcmahon re: paresis R HD  Chief Complaint  Patient presents with   Follow-up    Breathing has improved some since the last visit. No new co's.   Dyspnea:  walking 1.5 hills 5 days per week on 3lpm but sats down to 70s on hills Cough: better  Sleeping: flat bed one pillow SABA use: none  02: 3lpm 24/7  Covid status: x 3 vax and infected x 2  Rec Make sure you check your oxygen saturation  at your highest level of activity  t  If can't maintain over 90% with your desired level of activity, we can get [facility] to best fit analyis for your portable system.    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 On 3-4L O2 cont.   Dyspnea:  walking flat trail  stopping to check sats every 5 min / overall better activity vs baseline  Cough: none  Sleeping: flat bed one pillow SABA use: none  02: 3lpm sitting / sleeping  highest 4lpm continuous but is not really titrating as rec at highest level of activity as rec  Covid status: vax x 4  Rec I will refer you for best fit for portable 02 for [facility]  To get the most out of exercise, you need to be continuously aware that you are short of breath,  Please schedule a follow up visit in 3 months but call sooner if needed  Add CT chest with contrast re R HD /? Hernia ? > not done as of 06/09/2021    06/09/2021  f/u ov/[name] office/Megan Mcmahon re: R HD paresis/ 02 dep  maint on 02 only  / no meds  Chief Complaint  Patient presents with   Follow-up    3-4LO2 cont all the time.Pt had flu 03/2021.  Feels breathing  has worsened since having flu  Dyspnea:  no longer walking trails but room to room  Cough: dry  Sleeping: flat bed one pillow  SABA use: none  02: 3 lpm not titrating but rec was to increase to keep sats > 90% by continuous flow 02 Covid status: vax x 4      No obvious day to day or daytime variability or assoc excess/ purulent sputum or mucus plugs or hemoptysis or cp or chest tightness, subjective wheeze or overt sinus or hb symptoms.   Sleeping as above ok  without nocturnal  or early am exacerbation  of respiratory  c/o's or need for noct saba. Also denies any obvious fluctuation of symptoms with weather or environmental changes or other aggravating or alleviating factors except as outlined above   No unusual exposure hx or h/o childhood pna/ asthma or knowledge of premature birth.  Current Allergies, Complete Past Medical History, Past Surgical History, Family History, and Social History were reviewed in [employer] record.  ROS  The following are not active complaints unless bolded Hoarseness, sore throat, dysphagia, dental problems, itching, sneezing,  nasal congestion or discharge of excess mucus or purulent secretions, ear ache,   fever, chills, sweats, unintended wt loss or wt gain, classically pleuritic or exertional cp,  orthopnea pnd or arm/hand swelling  or leg swelling, presyncope, palpitations, abdominal pain, anorexia, nausea, vomiting, diarrhea  or change in bowel habits or change in bladder habits, change in stools or change in urine, dysuria, hematuria,  rash, arthralgias, visual complaints, headache/ pm not am  numbness, weakness or ataxia or problems with walking or coordination,  change in mood or  memory.        Current Meds  Medication Sig   ALPRAZolam (XANAX) 0.5 MG tablet Take 0.5 mg by mouth 3 (three) times daily as needed for anxiety.   amitriptyline (ELAVIL) 50 MG tablet Take 25 mg by mouth at bedtime as needed for sleep.   DULoxetine  (CYMBALTA) 60 MG capsule Take 60 mg by mouth daily.   escitalopram (LEXAPRO) 10 MG tablet Take 10 mg by mouth daily as needed.   famotidine (PEPCID) 20 MG tablet One after supper   hydrochlorothiazide (HYDRODIURIL) 25 MG tablet Take 25 mg by mouth daily.   levothyroxine (SYNTHROID) 50 MCG tablet Take 50 mcg by mouth daily  before breakfast.   metoprolol succinate (TOPROL-XL) 50 MG 24 hr tablet Take 75 mg by mouth daily. Take with or immediately following a meal.   pantoprazole (PROTONIX) 40 MG tablet Take 1 tablet (40 mg total) by mouth daily. Take 30-60 min before first meal of the day   pravastatin (PRAVACHOL) 20 MG tablet Take 20 mg by mouth daily.   pregabalin (LYRICA) 75 MG capsule Take 75 mg by mouth 2 (two) times daily.   tolterodine (DETROL LA) 4 MG 24 hr capsule Take 4 mg by mouth daily.   traMADol (ULTRAM) 50 MG tablet Take by mouth. 1/2 tab 2-3X day                      Past Medical History:  Diagnosis Date   Chronic kidney disease, stage 3 (HCC)    Fibromyalgia    GAD (generalized anxiety disorder)    GERD (gastroesophageal reflux disease)    Hyperlipidemia    Hypertension    Hypothyroidism          Objective:    Wts 06/09/2021          193 03/09/2021       198  11/18/2020       195   08/11/20 199 lb 3.2 oz (90.4 kg)  07/16/20 195 lb (88.5 kg)  06/22/20 197 lb (89.4 kg)    Vital signs reviewed  06/09/2021  - Note at rest 02 sats  97% on 2lpm   General appearance:    obese wf nad        HEENT : pt wearing mask not removed for exam due to covid -19 concerns.    NECK :  without JVD/Nodes/TM/ nl carotid upstrokes bilaterally   LUNGS: no acc muscle use,  Nl contour chest with decreased bs both bases esp on R and  insp bibasilar crackles as well    CV:  RRR  no s3 or murmur or increase in P2, and no edema   ABD:  soft and nontender with nl inspiratory excursion in the supine position. No bruits or organomegaly appreciated, bowel sounds nl  MS:  Nl gait/  ext warm without deformities, calf tenderness, cyanosis or clubbing No obvious joint restrictions   SKIN: warm and dry without lesions    NEURO:  alert, approp, nl sensorium with  no motor or cerebellar deficits apparent.            Assessment

## 2021-06-29 ENCOUNTER — Ambulatory Visit (HOSPITAL_COMMUNITY)
Admission: RE | Admit: 2021-06-29 | Discharge: 2021-06-29 | Disposition: A | Discharge status: Home / Self Care | Payer: Medicare Other | Source: Ambulatory Visit | Attending: Internal Medicine | Admitting: Internal Medicine

## 2021-06-29 ENCOUNTER — Other Ambulatory Visit: Payer: Self-pay

## 2021-06-29 DIAGNOSIS — J9611 Chronic respiratory failure with hypoxia: Secondary | ICD-10-CM | POA: Insufficient documentation

## 2021-06-29 DIAGNOSIS — J479 Bronchiectasis, uncomplicated: Secondary | ICD-10-CM | POA: Diagnosis not present

## 2021-06-29 DIAGNOSIS — J9612 Chronic respiratory failure with hypercapnia: Secondary | ICD-10-CM | POA: Insufficient documentation

## 2021-06-29 DIAGNOSIS — J84112 Idiopathic pulmonary fibrosis: Secondary | ICD-10-CM | POA: Diagnosis not present

## 2021-06-29 DIAGNOSIS — I517 Cardiomegaly: Secondary | ICD-10-CM | POA: Diagnosis not present

## 2021-06-29 DIAGNOSIS — S22060A Wedge compression fracture of T7-T8 vertebra, initial encounter for closed fracture: Secondary | ICD-10-CM | POA: Diagnosis not present

## 2021-07-01 ENCOUNTER — Telehealth: Payer: Self-pay | Admitting: Internal Medicine

## 2021-07-02 NOTE — Telephone Encounter (Signed)
Megan Mcmahon, please advise if you have received this order.

## 2021-07-02 NOTE — Telephone Encounter (Signed)
CMN has been signed off in [employer]

## 2021-07-25 ENCOUNTER — Other Ambulatory Visit: Payer: Self-pay | Admitting: Internal Medicine

## 2021-08-11 DIAGNOSIS — R0609 Other forms of dyspnea: Secondary | ICD-10-CM | POA: Diagnosis not present

## 2021-08-11 DIAGNOSIS — Z6836 Body mass index (BMI) 36.0-36.9, adult: Secondary | ICD-10-CM | POA: Diagnosis not present

## 2021-08-11 DIAGNOSIS — S22000D Wedge compression fracture of unspecified thoracic vertebra, subsequent encounter for fracture with routine healing: Secondary | ICD-10-CM | POA: Diagnosis not present

## 2021-08-11 DIAGNOSIS — M810 Age-related osteoporosis without current pathological fracture: Secondary | ICD-10-CM | POA: Diagnosis not present

## 2021-08-11 DIAGNOSIS — R0602 Shortness of breath: Secondary | ICD-10-CM | POA: Diagnosis not present

## 2021-08-11 DIAGNOSIS — J986 Disorders of diaphragm: Secondary | ICD-10-CM | POA: Diagnosis not present

## 2021-08-26 DIAGNOSIS — Z20822 Contact with and (suspected) exposure to covid-19: Secondary | ICD-10-CM | POA: Diagnosis not present

## 2021-09-03 DIAGNOSIS — Z20822 Contact with and (suspected) exposure to covid-19: Secondary | ICD-10-CM | POA: Diagnosis not present

## 2021-09-11 DIAGNOSIS — Z20822 Contact with and (suspected) exposure to covid-19: Secondary | ICD-10-CM | POA: Diagnosis not present

## 2021-11-16 DIAGNOSIS — E781 Pure hyperglyceridemia: Secondary | ICD-10-CM | POA: Diagnosis not present

## 2021-11-16 DIAGNOSIS — N183 Chronic kidney disease, stage 3 unspecified: Secondary | ICD-10-CM | POA: Diagnosis not present

## 2021-11-16 DIAGNOSIS — E7849 Other hyperlipidemia: Secondary | ICD-10-CM | POA: Diagnosis not present

## 2021-11-16 DIAGNOSIS — E039 Hypothyroidism, unspecified: Secondary | ICD-10-CM | POA: Diagnosis not present

## 2021-11-16 DIAGNOSIS — R5383 Other fatigue: Secondary | ICD-10-CM | POA: Diagnosis not present

## 2021-11-16 DIAGNOSIS — I10 Essential (primary) hypertension: Secondary | ICD-10-CM | POA: Diagnosis not present

## 2021-11-16 DIAGNOSIS — K2100 Gastro-esophageal reflux disease with esophagitis, without bleeding: Secondary | ICD-10-CM | POA: Diagnosis not present

## 2021-11-20 IMAGING — DX DG CHEST 2V
2 series · 2 of 2 positions shown · non-contrast
Comparison: 06/24/2020

CLINICAL DATA: Dyspnea on exertion

EXAM:
CHEST - 2 VIEW

[chest pa]
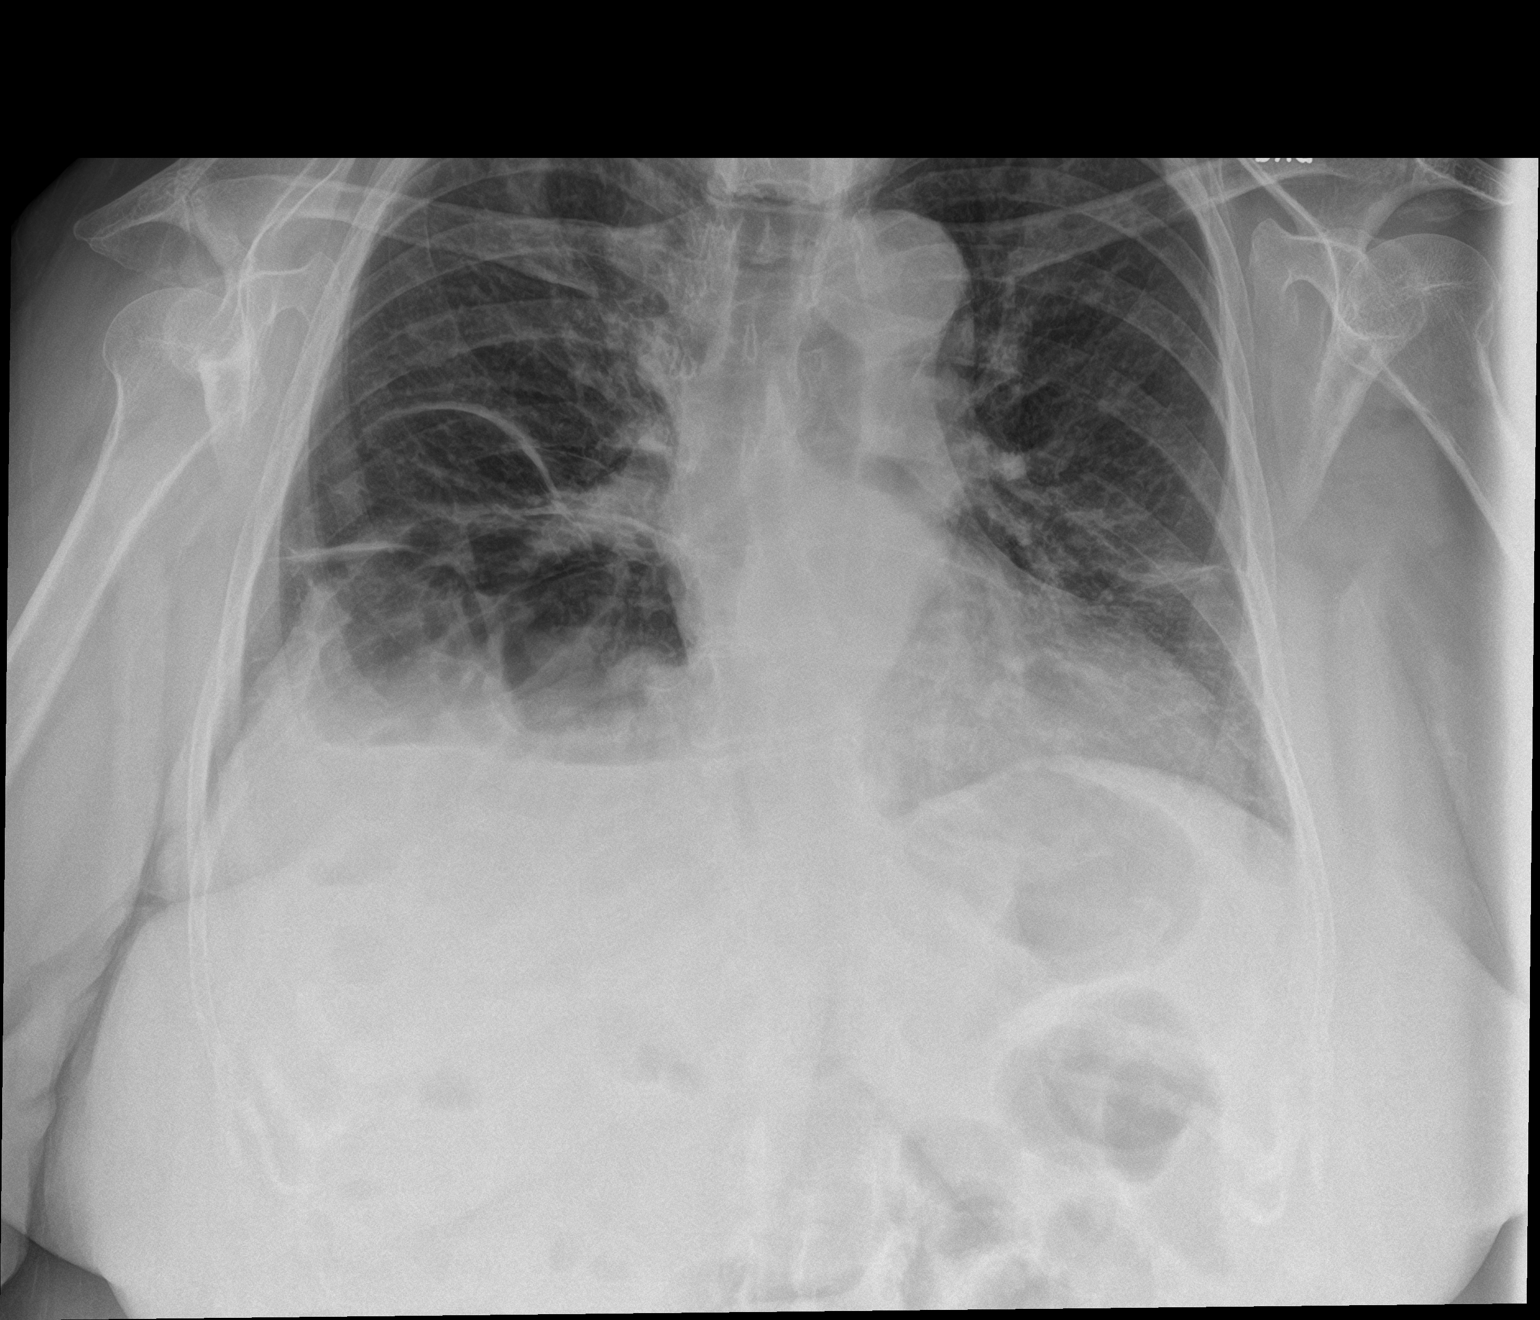

[chest lat]
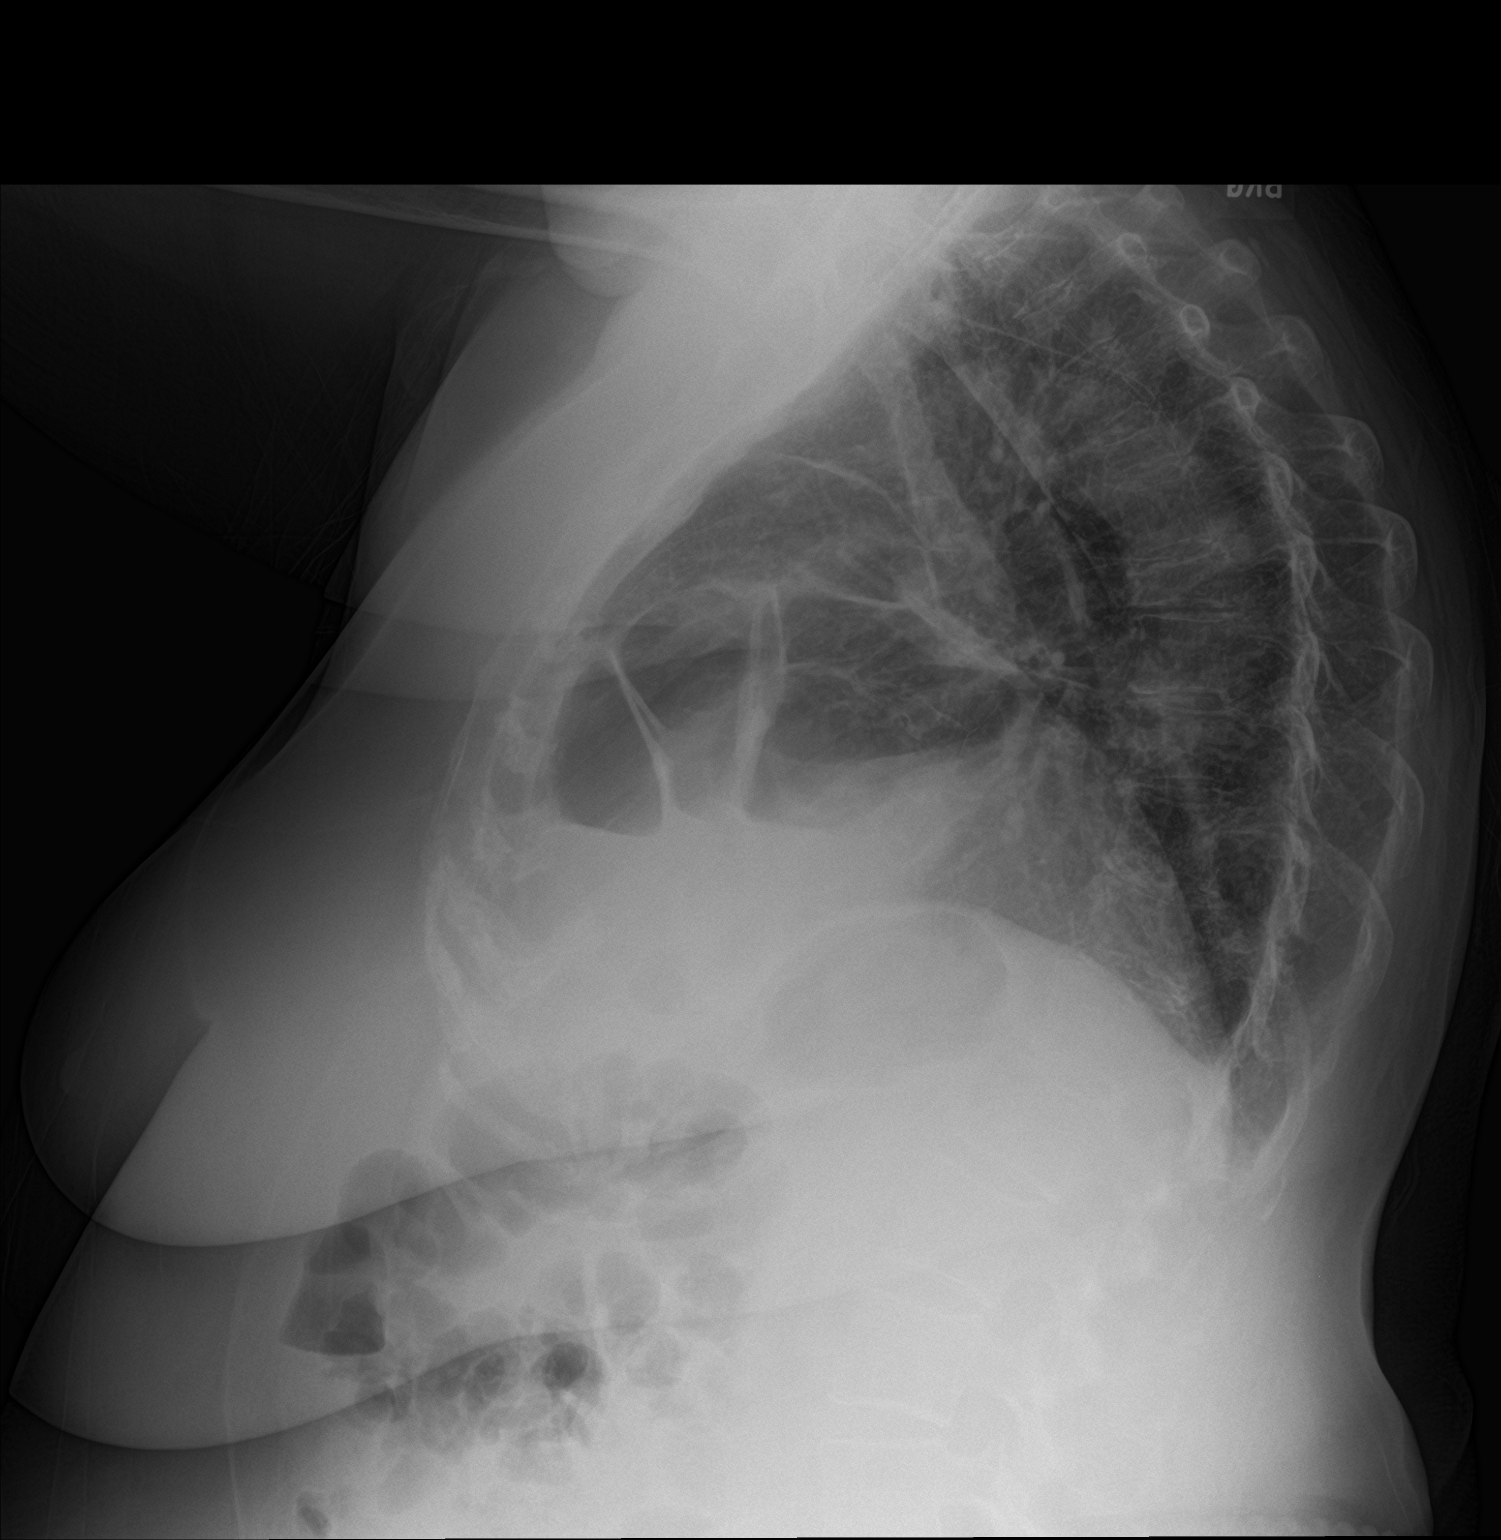

[2 of 2 positions shown; findings below may reference images not displayed]

FINDINGS: Unchanged multifocal atelectasis. Right hemidiaphragm elevation with
colonic interposition. Normal cardiomediastinal contours.
IMPRESSION: Unchanged chest radiograph.  No acute airspace disease.

## 2021-11-23 DIAGNOSIS — M810 Age-related osteoporosis without current pathological fracture: Secondary | ICD-10-CM | POA: Diagnosis not present

## 2021-11-23 DIAGNOSIS — Z6836 Body mass index (BMI) 36.0-36.9, adult: Secondary | ICD-10-CM | POA: Diagnosis not present

## 2021-11-23 DIAGNOSIS — E039 Hypothyroidism, unspecified: Secondary | ICD-10-CM | POA: Diagnosis not present

## 2021-11-23 DIAGNOSIS — M546 Pain in thoracic spine: Secondary | ICD-10-CM | POA: Diagnosis not present

## 2021-11-23 DIAGNOSIS — I10 Essential (primary) hypertension: Secondary | ICD-10-CM | POA: Diagnosis not present

## 2021-11-23 DIAGNOSIS — M797 Fibromyalgia: Secondary | ICD-10-CM | POA: Diagnosis not present

## 2021-11-23 DIAGNOSIS — F411 Generalized anxiety disorder: Secondary | ICD-10-CM | POA: Diagnosis not present

## 2021-11-23 DIAGNOSIS — E7849 Other hyperlipidemia: Secondary | ICD-10-CM | POA: Diagnosis not present

## 2021-11-23 DIAGNOSIS — S22000D Wedge compression fracture of unspecified thoracic vertebra, subsequent encounter for fracture with routine healing: Secondary | ICD-10-CM | POA: Diagnosis not present

## 2021-11-23 DIAGNOSIS — G44209 Tension-type headache, unspecified, not intractable: Secondary | ICD-10-CM | POA: Diagnosis not present

## 2021-11-23 DIAGNOSIS — J989 Respiratory disorder, unspecified: Secondary | ICD-10-CM | POA: Diagnosis not present

## 2021-12-08 ENCOUNTER — Ambulatory Visit (INDEPENDENT_AMBULATORY_CARE_PROVIDER_SITE_OTHER): Payer: Medicare Other | Admitting: Internal Medicine

## 2021-12-08 ENCOUNTER — Encounter: Payer: Self-pay | Admitting: Internal Medicine

## 2021-12-08 DIAGNOSIS — R058 Other specified cough: Secondary | ICD-10-CM

## 2021-12-08 DIAGNOSIS — J9612 Chronic respiratory failure with hypercapnia: Secondary | ICD-10-CM

## 2021-12-08 DIAGNOSIS — R0609 Other forms of dyspnea: Secondary | ICD-10-CM

## 2021-12-08 DIAGNOSIS — J9611 Chronic respiratory failure with hypoxia: Secondary | ICD-10-CM | POA: Diagnosis not present

## 2021-12-08 NOTE — Progress Notes (Unsigned)
Megan Mcmahon, female    DOB: December 14, 1950   MRN: [id number]   Brief patient profile:  89  yowf never smoker with baseline 165-170 noted doe x 2002 when noted could not keep up with husband then covid x 10 Apr 2019 and Jan 2022 > never admitted but much worse p second episode so referred to pulmonary clinic in [place]  07/16/2020 by Dr  Gracy Racer with [facility] though Echo ok 07/02/20     History of Present Illness  07/16/2020  Pulmonary/ 1st [facility] eval/ Megan Mcmahon / [place] [facility]  Chief Complaint  Patient presents with   Pulmonary Consult    Referred by Dr Diona Browner. Pt c/o SOB for the past year.  She gets winded doing light housework. She also c/o non prod cough.   Dyspnea:  MMRC1 = can walk nl pace, flat grade, can't hurry or go uphills or steps s sob   Cough: min  Dry  Day > noct  Sleep: can't sleep on back even before covid now sleeping on side one side and bed is flat  SABA use:  Few years back with bronchitis seemed to help none since.  02 : has it, not using rec Stop fosamax  Pantoprazole (protonix) 40 mg   Take  30-60 min before first meal of the day and Pepcid (famotidine)  20 mg one after supper  until return to [facility] - this is the best way to tell whether stomach acid is contributing to your problem.        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 She has noticed occ wheezing.   Dyspnea:  Megan Mcmahon is as much as she's done, walking around on 3lpm did not check sats as rec  Cough: none  Sleeping: one pillow / flat bed / no cough noct  SABA use: none  02: 3lpm 24/7  Covid status vax x 3 x infection   Lung cancer screening: never smoker  Rec Make sure you check your oxygen saturation  at your highest  level of activity  to be sure it stays over 90% and adjust  02 flow upward to maintain this level if needed but remember to turn it back to previous settings when you stop (to conserve your supply).  To get the most out of exercise, you need to be continuously aware that you are short of breath   11/18/2020  f/u ov/[place] [facility]/Megan Mcmahon re: paresis R HD  Chief Complaint  Patient presents with   Follow-up    Breathing has improved some since the last visit. No new co's.   Dyspnea:  walking 1.5 hills 5 days per week on 3lpm but sats down to 70s on hills Cough: better  Sleeping: flat bed one pillow SABA use: none  02: 3lpm 24/7  Covid status: x 3 vax and infected x 2  Rec Make sure you check your oxygen saturation  at your highest level of activity  t  If can't maintain over 90% with your desired level of activity, we can get adapt to best fit analyis for your portable system.    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 On 3-4L O2 cont.   Dyspnea:  walking flat trail  stopping to check sats every 5 min / overall better activity vs baseline  Cough: none  Sleeping: flat bed one pillow SABA use: none  02: 3lpm sitting / sleeping  highest 4lpm continuous but is not really titrating as rec at highest level of activity as rec  Covid status: vax x 4  Rec I will refer you for best fit for portable 02 for adapt  To get the most out of exercise, you need to be continuously aware that you are short of breath,  Please schedule a follow up visit in 3 months but call sooner if needed  Add CT chest with contrast re R HD /? Hernia ? 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 Coughing more.   Dyspnea:  limited by back and breathing about the same only goes to [employer] and doctor  Cough:  non productive, worse in pm /  Sleeping: flat bed one pillow  SABA use: rarely  02: 3lpm  24/7 and no adjust         No obvious day to day or daytime variability or assoc excess/ purulent sputum or mucus plugs or hemoptysis or cp or chest tightness, subjective wheeze or overt sinus or hb symptoms.   Sleeping  without nocturnal  or early am exacerbation  of respiratory  c/o's or need for noct saba. Also denies any obvious fluctuation of symptoms with weather or environmental changes or other aggravating or alleviating factors except as outlined above   No unusual exposure hx or h/o childhood pna/ asthma or knowledge of premature birth.  Current Allergies, Complete Past Medical History, Past Surgical History, Family History, and Social History were reviewed in [employer] record.  ROS  The following are not active complaints unless bolded Hoarseness, sore throat, dysphagia, dental problems, itching, sneezing,  nasal congestion or discharge of excess mucus or purulent secretions, ear ache,   fever, chills, sweats, unintended wt loss or wt gain, classically pleuritic or exertional cp,  orthopnea pnd or arm/hand swelling  or leg  swelling, presyncope, palpitations, abdominal pain, anorexia, nausea, vomiting, diarrhea  or change in bowel habits or change in bladder habits, change in stools or change in urine, dysuria, hematuria,  rash, arthralgias, visual complaints, headache, numbness, weakness or ataxia or problems with walking  or coordination,  change in mood or  memory.        Current Meds  Medication Sig   alendronate (FOSAMAX) 70 MG tablet Take 70 mg by mouth once a week.   ALPRAZolam (XANAX) 0.5 MG tablet Take 0.5 mg by mouth 3 (three) times daily as needed for anxiety.   amitriptyline (ELAVIL) 50 MG tablet Take 25 mg by mouth at bedtime as needed for sleep.   DULoxetine (CYMBALTA) 60 MG capsule Take 60 mg by mouth daily.   escitalopram (LEXAPRO) 10 MG tablet Take 10 mg by mouth daily as needed.   famotidine (PEPCID) 20 MG tablet TAKE 1 TABLET DAILY AFTER SUPPER   hydrochlorothiazide (HYDRODIURIL) 25 MG tablet Take 25 mg by mouth daily.   levothyroxine (SYNTHROID) 50 MCG tablet Take 50 mcg by mouth daily before breakfast.   metoprolol succinate (TOPROL-XL) 50 MG 24 hr tablet Take 75 mg by mouth daily. Take with or immediately following a meal.   pantoprazole (PROTONIX) 40 MG tablet Take 1 tablet (40 mg total) by mouth daily. Take 30-60 min before first meal of the day   pravastatin (PRAVACHOL) 20 MG tablet Take 20 mg by mouth daily.   pregabalin (LYRICA) 75 MG capsule Take 75 mg by mouth 2 (two) times daily.   tolterodine (DETROL LA) 4 MG 24 hr capsule Take 4 mg by mouth daily.   traMADol (ULTRAM) 50 MG tablet Take by mouth. 1/2 tab 2-3X day   VENTOLIN HFA 108 (90 Base) MCG/ACT inhaler SMARTSIG:2 Puff(s) By Mouth 4 Times Daily                      Past Medical History:  Diagnosis Date   Chronic kidney disease, stage 3 (HCC)    Fibromyalgia    GAD (generalized anxiety disorder)    GERD (gastroesophageal reflux disease)    Hyperlipidemia    Hypertension    Hypothyroidism          Objective:     Wts  12/08/2021         200  06/09/2021         193 03/09/2021       198  11/18/2020       195   08/11/20 199 lb 3.2 oz (90.4 kg)  07/16/20 195 lb (88.5 kg)  06/22/20 197 lb (89.4 kg)    Vital signs reviewed  12/08/2021  - Note at rest 02 sats  93% on 3lpm cont    General appearance:    pleasant amb mod obese female nad     HEENT : Oropharynx  clear     Nasal turbinates nl   NECK :  without  apparent JVD/ palpable Nodes/TM    LUNGS: no acc muscle use,  Nl contour chest with decrease bs L base and minimal bilateral insp crackles.   CV:  RRR  no s3 or murmur or increase in P2, and no edema   ABD:  obese soft and nontender with nl inspiratory excursion in the supine position. No bruits or organomegaly appreciated   MS:  Nl gait/ ext warm without deformities Or obvious joint restrictions  calf tenderness, cyanosis or clubbing    SKIN: warm and dry without lesions    NEURO:  alert, approp, nl sensorium with  no motor or cerebellar deficits apparent.         I personally reviewed images and agree with radiology impression as follows:   Chest HRCT  06/29/21 1. Basilar  predominant patchy volume loss, mild bronchiectasis and mild coarsening  Findings are suggestive of an alternative diagnosis (not UIP) per consensus guidelines:  2. Small to borderline enlarged mediastinal lymph nodes may be reactive. 3. T7 and T8 compression fractures are new from 03/09/2021. 4. Enlarged pulmonic trunk, indicative of pulmonary arterial hypertension.     Assessment

## 2021-12-08 NOTE — Patient Instructions (Addendum)
Change Pepcid   to take 20 mg after bfast and supper   Consider changing fosamax to prolia / reclast per Dr Neita Carp if cough gets any worse   Make sure you check your oxygen saturation  AT  your highest level of activity (not after you stop)   to be sure it stays over 90% and adjust  02 flow upward to maintain this level if needed but remember to turn it back to previous settings when you stop (to conserve your supply).   We will walk you today on 02 to determine your needed   Please schedule a follow up visit in 3 months but call sooner if needed

## 2021-12-09 ENCOUNTER — Encounter: Payer: Self-pay | Admitting: Internal Medicine

## 2021-12-09 NOTE — Assessment & Plan Note (Signed)
Try off fosamax 07/16/2020 and max gerd rx > improved 08/11/2020   Try again max h2's and if not improving would consider a trial off fosamax x 3 months with prolia as a bridge or permanent option if improves again off fasamax/ advised pt discuss with Dr Neita Carp at next [facility]   Each maintenance medication was reviewed in detail including emphasizing most importantly the difference between maintenance and prns and under what circumstances the prns are to be triggered using an action plan format where appropriate.  Total time for H and P, chart review, counseling, reviewing 02 device(s) , directly observing portions of ambulatory 02 saturation study/ and generating customized AVS unique to this office visit / same day charting = 30 min

## 2021-12-09 NOTE — Assessment & Plan Note (Signed)
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 Chronicity probably at least since 2019 with paresis on SNIFF 07/27/20 - HCO3  = 30  07/16/2020  - 07/16/2020 after desat 88% on 2lpm sat x 100 ft, o2 was increased to 3lpm and she walked an additional 221ft and sats at end 92% 3lpm/pt c/o fatigue and min SOB - ambulatory eval 03/09/21 desat to 77% on pulse 02 @ 4 lpm so rec cont 02 D/E tanks and adjust to keep > 90% - 06/09/2021 slow pace on 3LO2 cont. SOB p  Approx. 150 ft with lowest sats 92%  -12/08/2021 patients fingers were cold and pulse ox would not read consistently during walk. Patient titrated up to 5LO2 cont during walk because of SOB but could only do 150 ft slow pace     Best we can offer here is walk slower and max 02 with exertion when not able to monitor pulse ox accurately - will try a new head band probe next ov

## 2021-12-09 NOTE — Assessment & Plan Note (Signed)
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 Basilar predominant patchy volume loss, mild bronchiectasis and mild coarsening, findings which may be postinfectious/postinflammatory in etiology, including due to COVID-19  2. Small to borderline enlarged mediastinal lymph nodes may be reactive. 3. T7 and T8 compression fractures are new from 03/09/2021. 4. Enlarged pulmonic trunk, indicative of pulmonary arterial Hypertension.  No change in rx feasible

## 2022-01-07 ENCOUNTER — Encounter: Payer: Self-pay | Admitting: Internal Medicine

## 2022-01-07 DIAGNOSIS — J9611 Chronic respiratory failure with hypoxia: Secondary | ICD-10-CM

## 2022-01-07 NOTE — Telephone Encounter (Signed)
Fine with me

## 2022-01-07 NOTE — Telephone Encounter (Signed)
Going to [place] on vacation. Would like to get 2 more small oxygen green cannister tanks. [facility] said they would need a prescription and they could ship them right out. [phone] . Thank you.    Dr. Sherene Sires please advise is it okay for [place] to place this order?

## 2022-02-07 NOTE — Addendum Note (Signed)
Addended by: Fritzi Mandes D on: 02/07/2022 08:02 AM   Modules accepted: Orders

## 2022-02-22 DIAGNOSIS — Z23 Encounter for immunization: Secondary | ICD-10-CM | POA: Diagnosis not present

## 2022-03-01 DIAGNOSIS — Z23 Encounter for immunization: Secondary | ICD-10-CM | POA: Diagnosis not present

## 2022-03-02 DIAGNOSIS — M1712 Unilateral primary osteoarthritis, left knee: Secondary | ICD-10-CM | POA: Diagnosis not present

## 2022-03-02 DIAGNOSIS — M25562 Pain in left knee: Secondary | ICD-10-CM | POA: Diagnosis not present

## 2022-03-02 DIAGNOSIS — Z6836 Body mass index (BMI) 36.0-36.9, adult: Secondary | ICD-10-CM | POA: Diagnosis not present

## 2022-03-02 DIAGNOSIS — M1990 Unspecified osteoarthritis, unspecified site: Secondary | ICD-10-CM | POA: Diagnosis not present

## 2022-03-02 DIAGNOSIS — R030 Elevated blood-pressure reading, without diagnosis of hypertension: Secondary | ICD-10-CM | POA: Diagnosis not present

## 2022-03-16 ENCOUNTER — Encounter: Payer: Self-pay | Admitting: Orthopedic Surgery

## 2022-04-21 DIAGNOSIS — L03119 Cellulitis of unspecified part of limb: Secondary | ICD-10-CM | POA: Diagnosis not present

## 2022-04-21 DIAGNOSIS — R030 Elevated blood-pressure reading, without diagnosis of hypertension: Secondary | ICD-10-CM | POA: Diagnosis not present

## 2022-04-21 DIAGNOSIS — Z6836 Body mass index (BMI) 36.0-36.9, adult: Secondary | ICD-10-CM | POA: Diagnosis not present

## 2022-06-07 NOTE — Progress Notes (Unsigned)
Megan Mcmahon, female    DOB: 10-Jul-1950   MRN: [id number]   Brief patient profile:  31  yowf never smoker with baseline 165-170 noted doe x 2002 when noted could not keep up with husband then covid x 10 Apr 2019 and Jan 2022 > never admitted but much worse p second episode so referred to pulmonary clinic in [place]  07/16/2020 by Dr  Gwen Her with [facility] though Echo ok 07/02/20     History of Present Illness  07/16/2020  Pulmonary/ 1st office eval/ Megan Mcmahon / [place] [facility]  Chief Complaint  Patient presents with   Pulmonary Consult    Referred by Dr Domenic Polite. Pt c/o SOB for the past year.  She gets winded doing light housework. She also c/o non prod cough.   Dyspnea:  MMRC1 = can walk nl pace, flat grade, can't hurry or go uphills or steps s sob   Cough: min  Dry  Day > noct  Sleep: can't sleep on back even before covid now sleeping on side one side and bed is flat  SABA use:  Few years back with bronchitis seemed to help none since.  02 : has it, not using rec Stop fosamax  Pantoprazole (protonix) 40 mg   Take  30-60 min before first meal of the day and Pepcid (famotidine)  20 mg one after supper  until return to office - this is the best way to tell whether stomach acid is contributing to your problem.        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 She has noticed occ wheezing.   Dyspnea:  [employer] is as much as she's done, walking around on 3lpm did not check sats as rec  Cough: none  Sleeping: one pillow / flat bed / no cough noct  SABA use: none  02: 3lpm 24/7  Covid status vax x 3 x infection   Lung cancer screening: never smoker  Rec Make sure you check your oxygen saturation  at your  highest level of activity  to be sure it stays over 90% and adjust  02 flow upward to maintain this level if needed but remember to turn it back to previous settings when you stop (to conserve your supply).  To get the most out of exercise, you need to be continuously aware that you are short of breath   11/18/2020  f/u ov/[place] office/Megan Mcmahon re: paresis R HD  Chief Complaint  Patient presents with   Follow-up    Breathing has improved some since the last visit. No new co's.   Dyspnea:  walking 1.5 hills 5 days per week on 3lpm but sats down to 70s on hills Cough: better  Sleeping: flat bed one pillow SABA use: none  02: 3lpm 24/7  Covid status: x 3 vax and infected x 2  Rec Make sure you check your oxygen saturation  at your highest level of activity  t  If can't maintain over 90% with your desired level of activity, we can get adapt to best fit analyis for your portable system.    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 On 3-4L O2 cont.   Dyspnea:  walking flat trail  stopping to check sats every 5 min / overall better activity vs baseline  Cough: none  Sleeping: flat bed one pillow SABA use: none  02: 3lpm sitting / sleeping  highest 4lpm continuous but is not really titrating as rec at highest level of activity as rec  Covid status: vax x 4  Rec I will refer you for best fit for portable 02 for adapt  To get the most out of exercise, you need to be continuously aware that you are short of breath,  Please schedule a follow up visit in 3 months but call sooner if needed  Add CT chest with contrast re R HD /? Hernia ? 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 Coughing more.   Dyspnea:  limited by back and breathing about the same only goes to [employer] and doctor  Cough:  non productive, worse in pm /  Sleeping: flat bed one pillow  SABA use: rarely  02: 3lpm  24/7 and no adjust Rec Change Pepcid   to take 20 mg after bfast and supper  Consider changing fosamax to prolia / reclast per Dr Quintin Alto if cough gets any worse  Make sure you check your oxygen saturation  AT  your highest level of activity (not after you stop)   to be sure it stays over 90% We will walk you today on 02 to determine your needed    06/08/2022  f/u ov/[place] office/Megan Mcmahon re: *** maint on ***  No chief complaint on file.   Dyspnea:  *** Cough: *** Sleeping: *** SABA use: *** 02: *** Covid status: *** Lung cancer screening: ***   No obvious day to day or daytime variability or assoc excess/ purulent sputum or mucus plugs or hemoptysis or cp or chest tightness, subjective wheeze or overt sinus or hb symptoms.   *** without nocturnal  or early am exacerbation  of respiratory  c/o's or need for noct saba. Also denies any obvious fluctuation of symptoms with weather or environmental changes or other aggravating or alleviating factors except as outlined above   No unusual exposure hx or h/o childhood pna/ asthma or knowledge of  premature birth.  Current Allergies, Complete Past Medical History, Past Surgical History, Family History, and Social History were reviewed in [employer]  medical record.  ROS  The following are not active complaints unless bolded Hoarseness, sore throat, dysphagia, dental problems, itching, sneezing,  nasal congestion or discharge of excess mucus or purulent secretions, ear ache,   fever, chills, sweats, unintended wt loss or wt gain, classically pleuritic or exertional cp,  orthopnea pnd or arm/hand swelling  or leg swelling, presyncope, palpitations, abdominal pain, anorexia, nausea, vomiting, diarrhea  or change in bowel habits or change in bladder habits, change in stools or change in urine, dysuria, hematuria,  rash, arthralgias, visual complaints, headache, numbness, weakness or ataxia or problems with walking or coordination,  change in mood or  memory.        No outpatient medications have been marked as taking for the 06/08/22 encounter (Appointment) with Tanda Rockers, MD.                  Past Medical History:  Diagnosis Date   Chronic kidney disease, stage 3 ([facility])    Fibromyalgia    GAD (generalized anxiety disorder)    GERD (gastroesophageal reflux disease)    Hyperlipidemia    Hypertension    Hypothyroidism          Objective:    Wts  06/08/2022       *** 12/08/2021         200  06/09/2021         193 03/09/2021       198  11/18/2020       195   08/11/20 199 lb 3.2 oz (90.4 kg)  07/16/20 195 lb (88.5 kg)  06/22/20 197 lb (89.4 kg)    Vital signs reviewed  06/08/2022  - Note at rest 02 sats  ***% on ***   General appearance:    ***   decrease bs L base and minimal bilateral insp crackles***          Assessment

## 2022-06-08 ENCOUNTER — Ambulatory Visit (INDEPENDENT_AMBULATORY_CARE_PROVIDER_SITE_OTHER): Payer: Medicare Other | Admitting: Internal Medicine

## 2022-06-08 ENCOUNTER — Encounter: Payer: Self-pay | Admitting: Internal Medicine

## 2022-06-08 VITALS — BP 134/82 | HR 74 | Temp 97.6°F | Ht <= 58 in | Wt 197.2 lb

## 2022-06-08 DIAGNOSIS — J9612 Chronic respiratory failure with hypercapnia: Secondary | ICD-10-CM | POA: Diagnosis not present

## 2022-06-08 DIAGNOSIS — J9611 Chronic respiratory failure with hypoxia: Secondary | ICD-10-CM | POA: Diagnosis not present

## 2022-06-08 DIAGNOSIS — R0609 Other forms of dyspnea: Secondary | ICD-10-CM | POA: Diagnosis not present

## 2022-06-08 NOTE — Assessment & Plan Note (Signed)
Body mass index is 42.67 kg/m.  -  trending down slightly  Lab Results  Component Value Date   TSH 1.798 07/16/2020      Contributing to doe and risk of GERD >>>   reviewed the need and the process to achieve and maintain neg calorie balance > defer f/u primary care including intermittently monitoring thyroid status     Each maintenance medication was reviewed in detail including emphasizing most importantly the difference between maintenance and prns and under what circumstances the prns are to be triggered using an action plan format where appropriate.  Total time for H and P, chart review, counseling,  directly observing portions of ambulatory 02 saturation study/ and generating customized AVS unique to this office visit / same day charting  > 30 min

## 2022-06-08 NOTE — Assessment & Plan Note (Signed)
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 Chronicity probably at least since 2019 with paresis on SNIFF 07/27/20 - HCO3  = 30  07/16/2020  - 07/16/2020 after desat 88% on 2lpm sat x 100 ft, o2 was increased to 3lpm and she walked an additional 232ft and sats at end 92% 3lpm/pt c/o fatigue and min SOB - ambulatory eval 03/09/21 desat to 77% on pulse 02 @ 4 lpm so rec cont 02 D/E tanks and adjust to keep > 90% - 06/09/2021 slow pace on 3LO2 cont. SOB p  Approx. 150 ft with lowest sats 92%  -12/08/2021 patients fingers were cold and pulse ox would not read consistently during walk. Patient titrated up to 5LO2 cont during walk because of SOB but could only do 150 ft slow pace - 06/08/2022   Walked on 3lpm   x  1.5  lap(s) =  approx 231ft  @ slow pace, stopped due to desats to 88% improved on 4lpm     Rec: Make sure you check your oxygen saturation  AT  your highest level of activity (not after you stop)   to be sure it stays over 90% and adjust  02 flow upward to maintain this level if needed but remember to turn it back to previous settings when you stop (to conserve your supply).

## 2022-06-08 NOTE — Patient Instructions (Signed)
I emphasized that nasal steroids (flonase/nasacort aq) have no immediate benefit in terms of improving symptoms.  To help them reached the target tissue, the patient should use Afrin one - two puffs every 12 hours applied one min before using the nasal steroids.  Afrin should be stopped after no more than 5 days.  If the symptoms worsen, Afrin can be restarted after 5 days off of therapy to prevent rebound congestion from overuse of Afrin.  I also emphasized that in no way are nasal steroids a concern in terms of "addiction".     Make sure you check your oxygen saturation at your highest level of activity(NOT after you stop)  to be sure it stays over 90% and keep track of it at least once a week, more often if breathing getting worse, and let me know if losing ground. (Collect the dots to connect the dots approach)     Please schedule a follow up visit in 6 months but call sooner if needed

## 2022-06-08 NOTE — Assessment & Plan Note (Addendum)
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 Basilar predominant patchy volume loss, mild bronchiectasis and mild coarsening, findings which may be postinfectious/postinflammatory in etiology, including due to COVID-19  2. Small to borderline enlarged mediastinal lymph nodes may be reactive. 3. T7 and T8 compression fractures are new from 03/09/2021. 4. Enlarged pulmonic trunk, indicative of pulmonary arterial hypertension.  No evidence of any form of progressive fibrosing illness with main pulmonary issue = effects of obeity and paretic HD on ventilatory reserve/ dependent atx > 02 dep   Rec recumbent ex bike/ no change in rx

## 2022-06-28 DIAGNOSIS — N183 Chronic kidney disease, stage 3 unspecified: Secondary | ICD-10-CM | POA: Diagnosis not present

## 2022-06-28 DIAGNOSIS — R5383 Other fatigue: Secondary | ICD-10-CM | POA: Diagnosis not present

## 2022-06-28 DIAGNOSIS — E7849 Other hyperlipidemia: Secondary | ICD-10-CM | POA: Diagnosis not present

## 2022-06-28 DIAGNOSIS — E039 Hypothyroidism, unspecified: Secondary | ICD-10-CM | POA: Diagnosis not present

## 2022-06-28 DIAGNOSIS — I10 Essential (primary) hypertension: Secondary | ICD-10-CM | POA: Diagnosis not present

## 2022-07-05 DIAGNOSIS — F411 Generalized anxiety disorder: Secondary | ICD-10-CM | POA: Diagnosis not present

## 2022-07-05 DIAGNOSIS — M546 Pain in thoracic spine: Secondary | ICD-10-CM | POA: Diagnosis not present

## 2022-07-05 DIAGNOSIS — E7849 Other hyperlipidemia: Secondary | ICD-10-CM | POA: Diagnosis not present

## 2022-07-05 DIAGNOSIS — G44209 Tension-type headache, unspecified, not intractable: Secondary | ICD-10-CM | POA: Diagnosis not present

## 2022-07-05 DIAGNOSIS — M797 Fibromyalgia: Secondary | ICD-10-CM | POA: Diagnosis not present

## 2022-07-05 DIAGNOSIS — K2100 Gastro-esophageal reflux disease with esophagitis, without bleeding: Secondary | ICD-10-CM | POA: Diagnosis not present

## 2022-07-05 DIAGNOSIS — E039 Hypothyroidism, unspecified: Secondary | ICD-10-CM | POA: Diagnosis not present

## 2022-07-05 DIAGNOSIS — I10 Essential (primary) hypertension: Secondary | ICD-10-CM | POA: Diagnosis not present

## 2022-07-05 DIAGNOSIS — J989 Respiratory disorder, unspecified: Secondary | ICD-10-CM | POA: Diagnosis not present

## 2022-07-05 DIAGNOSIS — S22000D Wedge compression fracture of unspecified thoracic vertebra, subsequent encounter for fracture with routine healing: Secondary | ICD-10-CM | POA: Diagnosis not present

## 2022-07-07 ENCOUNTER — Encounter: Payer: Self-pay | Admitting: Radiology

## 2022-07-24 ENCOUNTER — Other Ambulatory Visit: Payer: Self-pay | Admitting: Internal Medicine

## 2022-09-09 ENCOUNTER — Encounter: Payer: Self-pay | Admitting: Internal Medicine

## 2022-11-03 IMAGING — CT CT CHEST HIGH RESOLUTION
2 of 7 series · 14 of 36 positions shown, 17 images · non-contrast
Comparison: None.

CLINICAL DATA: Chronic lung disease, shortness of breath on
exertion.



[Series 6: high resolution retro · axial · 0.61mm/px · z∈[+1198,+1412]mm · 11 of 257 slices shown, 14 images]
[im 22/257  mediastinal]
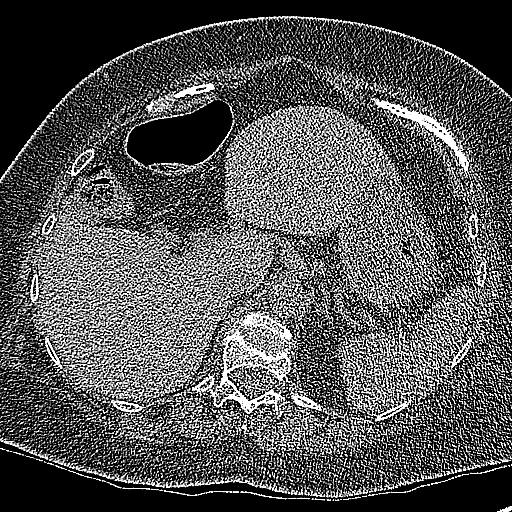
[im 22/257  lung]
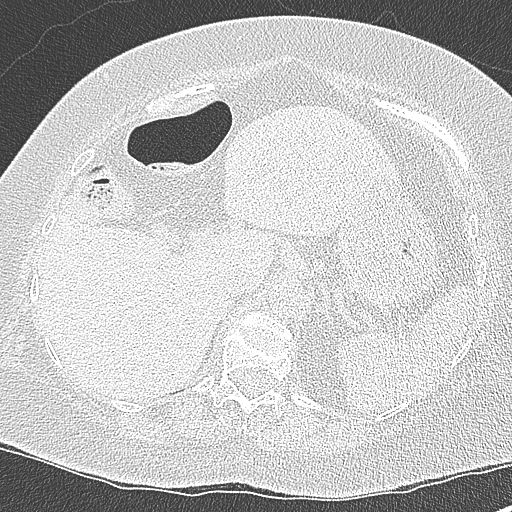
[im 43/257  lung]
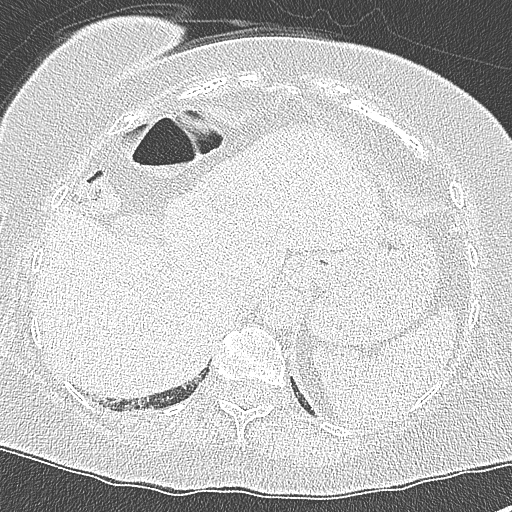
[im 65/257  lung]
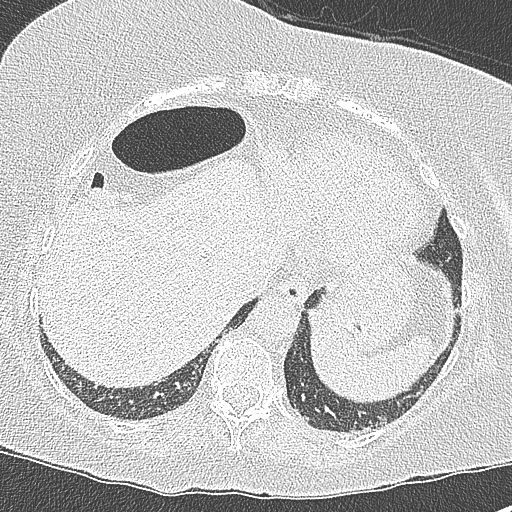
[im 86/257  lung]
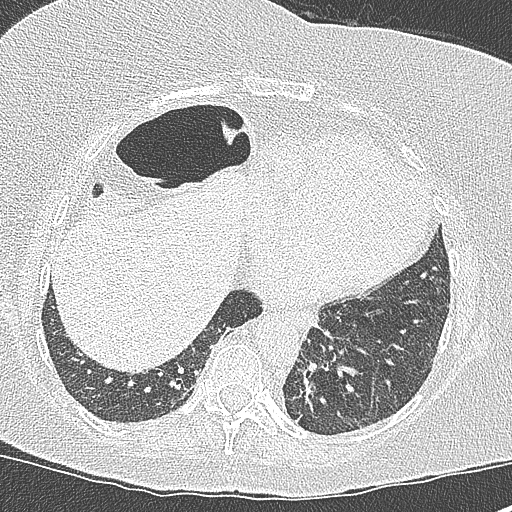
[im 107/257  mediastinal]
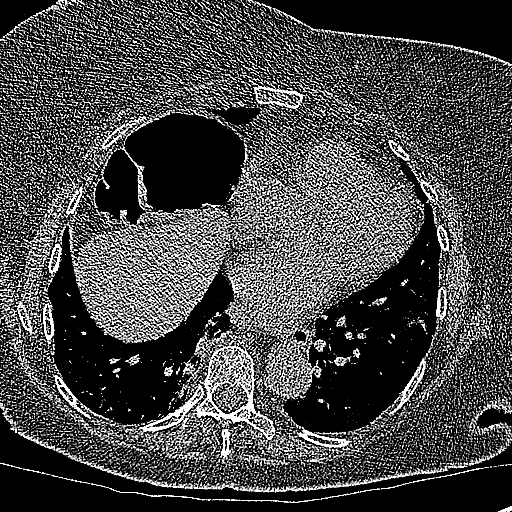
[im 107/257  lung]
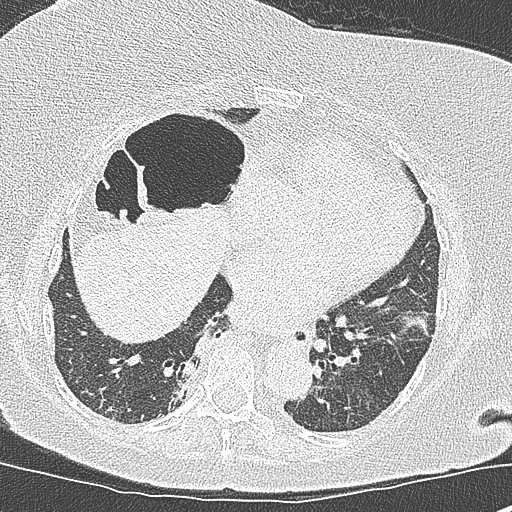
[im 129/257  lung]
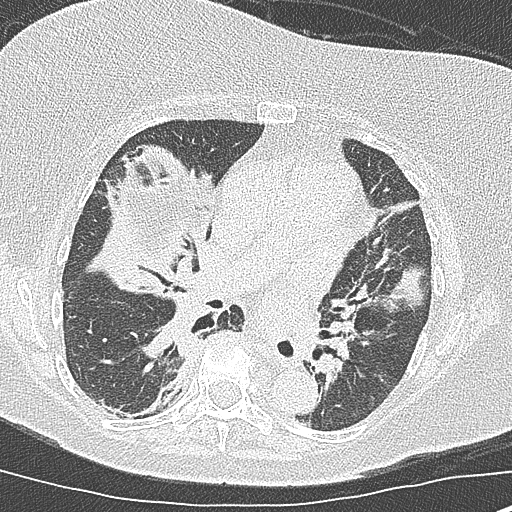
[im 150/257  lung]
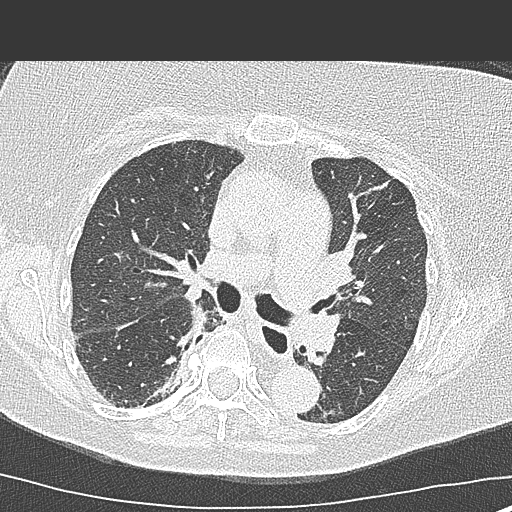
[im 171/257  lung]
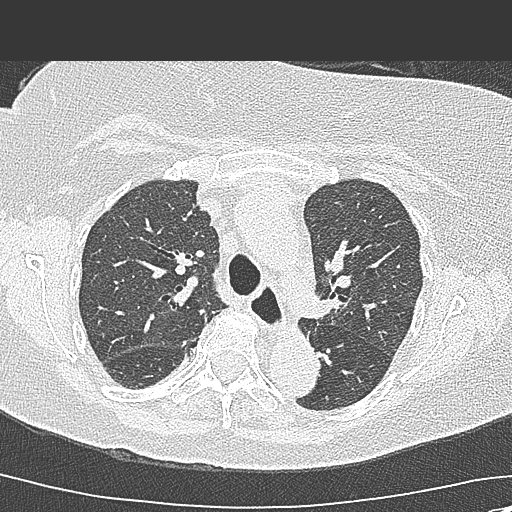
[im 193/257  mediastinal]
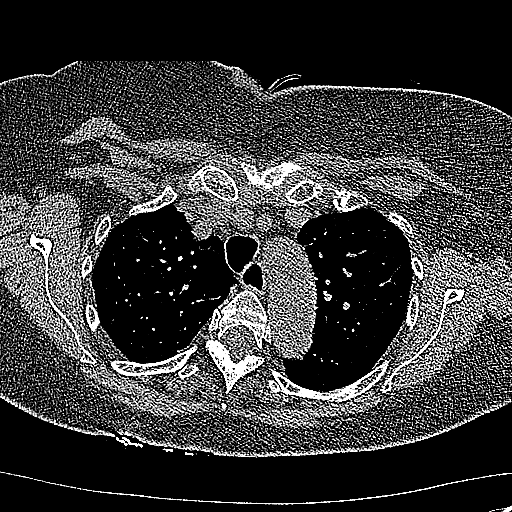
[im 193/257  lung]
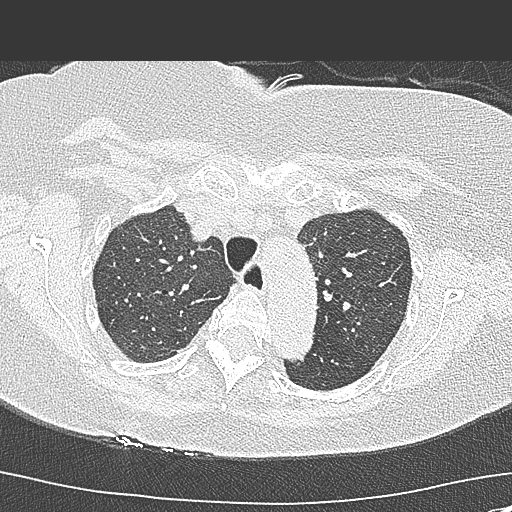
[im 214/257  lung]
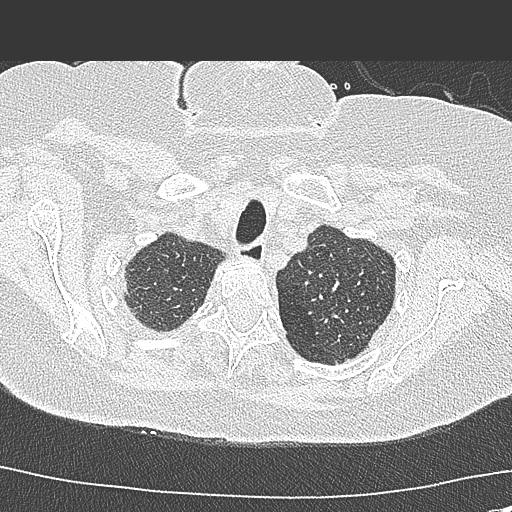
[im 235/257  lung]
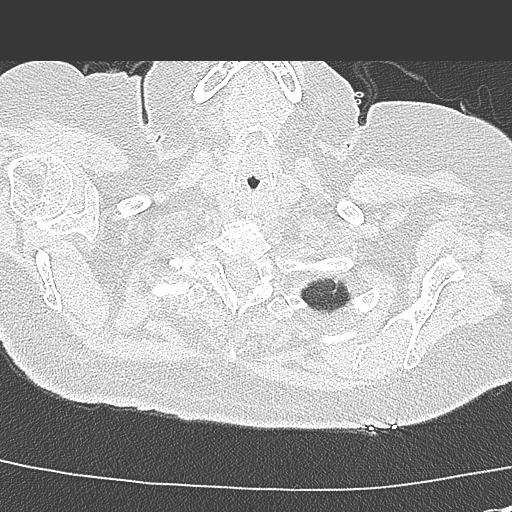

[Series 7: coronal · coronal · 0.54mm/px · 3 of 151 slices shown]
[im 31/151  lung]
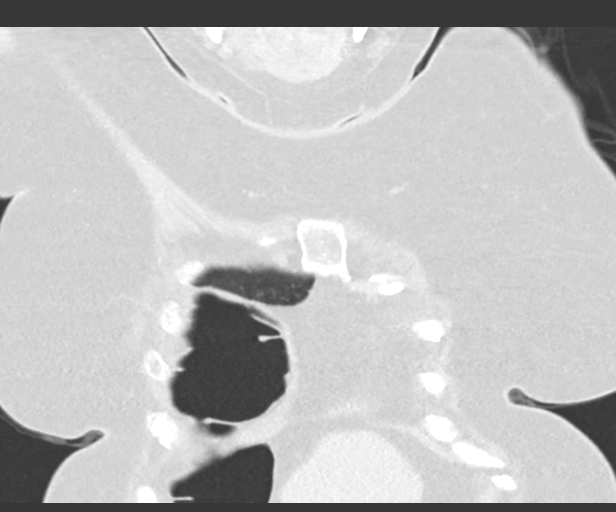
[im 61/151  lung]
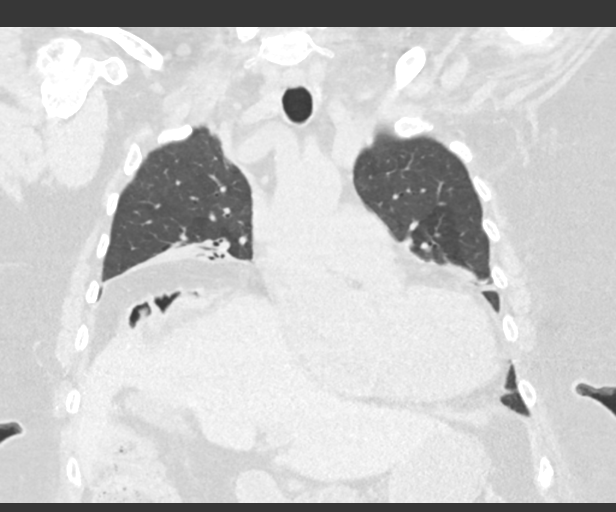
[im 91/151  lung]
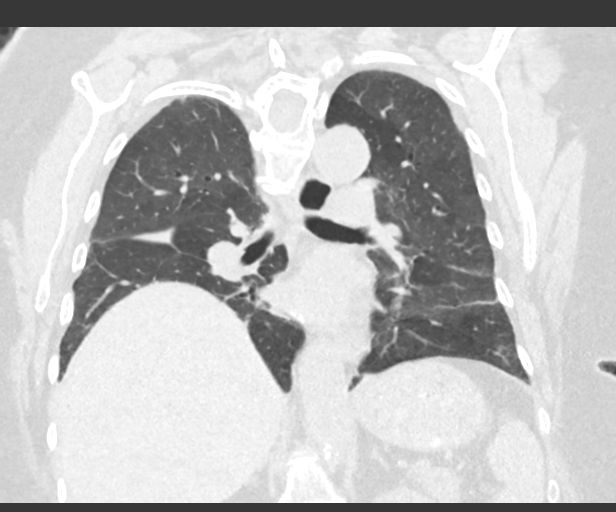

[14 of 36 positions shown; findings below may reference images not displayed]

FINDINGS: Cardiovascular: Enlarged pulmonic trunk and heart. No pericardial
effusion.

Mediastinum/Nodes: Mediastinal lymph nodes measure up to 10 mm in
the low right paratracheal station. Hilar regions are difficult to
evaluate without IV contrast. No axillary adenopathy. Esophagus is
dilated, which can be seen with dysmotility.

Lungs/Pleura: Patchy bibasilar volume loss, mild bronchiectasis and
minimal parenchymal coarsening. No subpleural reticulation or
architectural distortion. Expiratory phase imaging was not performed
in true expiration, limiting the evaluation for air trapping. No
pleural fluid. Airway is unremarkable.

Upper Abdomen: Visualized portions of the liver, gallbladder,
adrenal glands, left kidney, spleen, pancreas, stomach and bowel are
grossly unremarkable.

Musculoskeletal: Degenerative changes in the spine. No worrisome
lytic or sclerotic lesions. T5 compression deformity appears similar
to 03/09/2021. T7 and T8 compression deformities appear new,
however.
IMPRESSION: 1. Basilar predominant patchy volume loss, mild bronchiectasis and
mild coarsening, findings which may be
postinfectious/postinflammatory in etiology, including due to
ISXDZ-KR. Findings are suggestive of an alternative diagnosis (not
UIP) per consensus guidelines: Diagnosis of Idiopathic Pulmonary
Fibrosis: An Official ATS/ERS/JRS/ALAT Clinical Practice Guideline.
Am J Respir Crit Care Med Vol 198, Gabri 5, ppe55-[id number][DATE].
2. Small to borderline enlarged mediastinal lymph nodes may be
reactive.
3. T7 and T8 compression fractures are new from 03/09/2021.
4. Enlarged pulmonic trunk, indicative of pulmonary arterial
hypertension.

## 2022-11-21 DIAGNOSIS — H35432 Paving stone degeneration of retina, left eye: Secondary | ICD-10-CM | POA: Diagnosis not present

## 2022-12-05 ENCOUNTER — Ambulatory Visit: Payer: Medicare Other | Admitting: Internal Medicine

## 2022-12-12 NOTE — Progress Notes (Unsigned)
Megan Mcmahon, female    DOB: 11-Mar-1951   MRN: [id number]   Brief patient profile:  89 yowf never smoker with baseline 165-170 noted doe x 2002 when noted could not keep up with husband then covid x 10 Apr 2019 and Jan 2022 > never admitted but much worse p second episode so referred to pulmonary clinic in [place]  07/16/2020 by Dr  Gracy Racer with [facility] though Echo ok 07/02/20     History of Present Illness  07/16/2020  Pulmonary/ 1st office eval/  / [place] [facility]  Chief Complaint  Patient presents with   Pulmonary Consult    Referred by Dr Diona Browner. Pt c/o SOB for the past year.  She gets winded doing light housework. She also c/o non prod cough.   Dyspnea:  MMRC1 = can walk nl pace, flat grade, can't hurry or go uphills or steps s sob   Cough: min  Dry  Day > noct  Sleep: can't sleep on back even before covid now sleeping on side one side and bed is flat  SABA use:  Few years back with bronchitis seemed to help none since.  02 : has it, not using rec Stop fosamax  Pantoprazole (protonix) 40 mg   Take  30-60 min before first meal of the day and Pepcid (famotidine)  20 mg one after supper  until return to office - this is the best way to tell whether stomach acid is contributing to your problem.      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 She has noticed occ wheezing.   Dyspnea:  [name] is as much as she's done, walking around on 3lpm did not check sats as rec  Cough: none  Sleeping: one pillow / flat bed / no cough noct  SABA use: none  02: 3lpm 24/7  Covid status vax x 3 x infection   Lung cancer screening: never smoker  Rec Make sure you check your oxygen saturation  at your highest  level of activity  to be sure it stays over 90% and adjust  02 flow upward to maintain this level if needed but remember to turn it back to previous settings when you stop (to conserve your supply).  To get the most out of exercise, you need to be continuously aware that you are short of breath   06/09/2021  f/u ov/ office/ re: R HD paresis/ 02 dep  maint on 02 only  / no meds  Chief Complaint  Patient presents with   Follow-up    3-4LO2 cont all the time.Pt had flu 03/2021.  Feels breathing has worsened since having flu  Dyspnea:  no longer walking trails but room to room  Cough: dry  Sleeping: flat bed one pillow  SABA use: none  02: 3 lpm not titrating but rec was to increase to keep sats > 90% by continuous flow 02 Covid status: vax x 4  Rec Make sure you check your oxygen saturation  AT  your highest level of activity (not after you stop)   to be sure it stays over 90%  We will schedule a high resolution CT and call results    12/08/2021  f/u ov/[place] office/ re: RHD paresis/ 02 dep   and back on fosamax  Chief Complaint  Patient presents with   Follow-up    Breathing is about the same since last ov. Coughing more.   Dyspnea:  limited by back and breathing about the same only goes to [employer] and doctor  Cough:  non productive, worse in pm /  Sleeping: flat bed one pillow  SABA use: rarely  02: 3lpm  24/7 and no adjust Rec Change Pepcid   to take 20 mg after bfast and supper  Consider changing fosamax to prolia / reclast per Dr Neita Carp if cough gets any worse  Make sure you check your oxygen saturation  AT  your highest level of activity (not after you stop)   to be sure it stays over 90% We will walk you today on 02 to determine your needed    06/08/2022  f/u ov/[place] office/ re: RHD paresis  maint on gred rx   Chief Complaint  Patient presents with   Follow-up    Breathing improved slightly since last ov but some good days and some bad  days  Purple finger tips today at ov    Dyspnea:  more active with housekeeping/ shopping pushing cart roses  Cough: none/ min sniffles  Sleeping: flat bed/ two pillows  SABA use: not using  02: 3lpm  Covid status: infected twice/ vax max  Rec I emphasized that nasal steroids (flonase/nasacort aq) have no immediate benefit  Make sure you check your oxygen saturation at your highest level of activity(NOT after you stop)  to be sure it stays over 90%  Please schedule a follow up visit in 6 months but call sooner if needed      12/14/2022  63m f/u  f/u ov/ office/ re: RHD paresis/02 dep  maint on ***  No chief complaint on file.   Dyspnea:  *** Cough: *** Sleeping: *** SABA use: *** 02: *** Covid status: *** Lung cancer screening: ***   No obvious day to day or daytime variability or assoc excess/ purulent sputum or mucus plugs or hemoptysis or cp or chest tightness, subjective wheeze or overt sinus or hb symptoms.   *** without nocturnal  or early am exacerbation  of respiratory  c/o's or need for noct saba. Also denies any obvious fluctuation of symptoms with weather or environmental changes or other aggravating or alleviating factors except as outlined above   No unusual exposure hx or h/o childhood pna/ asthma or knowledge of premature birth.  Current Allergies, Complete Past Medical History, Past Surgical History, Family History, and Social History were reviewed in [employer] record.  ROS  The following are not active complaints unless bolded Hoarseness, sore throat, dysphagia, dental problems, itching, sneezing,  nasal congestion or discharge of excess mucus or purulent secretions, ear ache,   fever, chills, sweats, unintended wt loss or wt gain, classically pleuritic or exertional cp,  orthopnea pnd or arm/hand swelling  or leg swelling, presyncope, palpitations, abdominal pain, anorexia, nausea, vomiting, diarrhea  or change in bowel habits  or change in bladder habits, change in stools or change in urine, dysuria, hematuria,  rash, arthralgias, visual complaints, headache, numbness, weakness or ataxia or problems with walking or coordination,  change in mood or  memory.        No outpatient medications have been marked as taking for the 12/14/22 encounter (Appointment) with Nyoka Cowden, MD.  Past Medical History:  Diagnosis Date   Chronic kidney disease, stage 3 (HCC)    Fibromyalgia    GAD (generalized anxiety disorder)    GERD (gastroesophageal reflux disease)    Hyperlipidemia    Hypertension    Hypothyroidism          Objective:    Wts  12/14/2022          ***  06/08/2022       197 12/08/2021         200  06/09/2021         193 03/09/2021       198  11/18/2020       195   08/11/20 199 lb 3.2 oz (90.4 kg)  07/16/20 195 lb (88.5 kg)  06/22/20 197 lb (89.4 kg)    Vital signs reviewed  12/14/2022  - Note at rest 02 sats  ***% on ***   General appearance:    ***            Assessment

## 2022-12-14 ENCOUNTER — Encounter: Payer: Self-pay | Admitting: Internal Medicine

## 2022-12-14 ENCOUNTER — Ambulatory Visit (INDEPENDENT_AMBULATORY_CARE_PROVIDER_SITE_OTHER): Payer: Medicare Other | Admitting: Internal Medicine

## 2022-12-14 VITALS — BP 137/86 | HR 71 | Ht <= 58 in | Wt 201.0 lb

## 2022-12-14 DIAGNOSIS — R0609 Other forms of dyspnea: Secondary | ICD-10-CM

## 2022-12-14 DIAGNOSIS — J9611 Chronic respiratory failure with hypoxia: Secondary | ICD-10-CM

## 2022-12-14 DIAGNOSIS — J9612 Chronic respiratory failure with hypercapnia: Secondary | ICD-10-CM | POA: Diagnosis not present

## 2022-12-14 NOTE — Assessment & Plan Note (Signed)
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 Chronicity probably at least since 2019 with paresis on SNIFF 07/27/20 - HCO3  = 30  07/16/2020  - 07/16/2020 after desat 88% on 2lpm sat x 100 ft, o2 was increased to 3lpm and she walked an additional 266ft and sats at end 92% 3lpm/pt c/o fatigue and min SOB - ambulatory eval 03/09/21 desat to 77% on pulse 02 @ 4 lpm so rec cont 02 D/E tanks and adjust to keep > 90% - 06/09/2021 slow pace on 3LO2 cont. SOB p  Approx. 150 ft with lowest sats 92%  -12/08/2021 patients fingers were cold and pulse ox would not read consistently during walk. Patient titrated up to 5LO2 cont during walk because of SOB but could only do 150 ft slow pace    Again advised; Make sure you check your oxygen saturation  AT  your highest level of activity (not after you stop)   to be sure it stays over 90% and adjust  02 flow upward to maintain this level if needed but remember to turn it back to previous settings when you stop (to conserve your supply).

## 2022-12-14 NOTE — Assessment & Plan Note (Signed)
Body mass index is 43.5 kg/m.  -  trending up still  Lab Results  Component Value Date   TSH 1.798 07/16/2020      Contributing to doe and risk of GERD/dvt/PE >>>   reviewed the need and the process to achieve and maintain neg calorie balance > defer f/u primary care including intermittently monitoring thyroid status           Each maintenance medication was reviewed in detail including emphasizing most importantly the difference between maintenance and prns and under what circumstances the prns are to be triggered using an action plan format where appropriate.  Total time for H and P, chart review, counseling, reviewing 02/pulse ox device(s) and generating customized AVS unique to this office visit / same day charting = 32 min

## 2022-12-14 NOTE — Patient Instructions (Signed)
No change in recommendations   Make sure you check your oxygen saturation  AT  your highest level of activity (not after you stop)   to be sure it stays over 90% and adjust  02 flow upward to maintain this level if needed but remember to turn it back to previous settings when you stop (to conserve your supply).    Please schedule a follow up visit in 6  months but call sooner if needed

## 2022-12-14 NOTE — Assessment & Plan Note (Addendum)
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 Basilar predominant patchy volume loss, mild bronchiectasis and mild coarsening, findings which may be postinfectious/postinflammatory in etiology,   2. Small to borderline enlarged mediastinal lymph nodes may be reactive. 3. T7 and T8 compression fractures are new from 03/09/2021. 4. Enlarged pulmonic trunk, indicative of pulmonary arterial hypertension.  Most likely combination of obesity/ PF p covid and paresis R HD with no opportunity for specific rx other than adequate 02 (see separate a/p)

## 2022-12-27 DIAGNOSIS — E039 Hypothyroidism, unspecified: Secondary | ICD-10-CM | POA: Diagnosis not present

## 2022-12-27 DIAGNOSIS — E7849 Other hyperlipidemia: Secondary | ICD-10-CM | POA: Diagnosis not present

## 2022-12-27 DIAGNOSIS — E7801 Familial hypercholesterolemia: Secondary | ICD-10-CM | POA: Diagnosis not present

## 2022-12-27 DIAGNOSIS — N183 Chronic kidney disease, stage 3 unspecified: Secondary | ICD-10-CM | POA: Diagnosis not present

## 2022-12-27 DIAGNOSIS — I10 Essential (primary) hypertension: Secondary | ICD-10-CM | POA: Diagnosis not present

## 2023-01-03 DIAGNOSIS — M797 Fibromyalgia: Secondary | ICD-10-CM | POA: Diagnosis not present

## 2023-01-03 DIAGNOSIS — M810 Age-related osteoporosis without current pathological fracture: Secondary | ICD-10-CM | POA: Diagnosis not present

## 2023-01-03 DIAGNOSIS — E7849 Other hyperlipidemia: Secondary | ICD-10-CM | POA: Diagnosis not present

## 2023-01-03 DIAGNOSIS — M546 Pain in thoracic spine: Secondary | ICD-10-CM | POA: Diagnosis not present

## 2023-01-03 DIAGNOSIS — I10 Essential (primary) hypertension: Secondary | ICD-10-CM | POA: Diagnosis not present

## 2023-01-03 DIAGNOSIS — J989 Respiratory disorder, unspecified: Secondary | ICD-10-CM | POA: Diagnosis not present

## 2023-01-03 DIAGNOSIS — K2100 Gastro-esophageal reflux disease with esophagitis, without bleeding: Secondary | ICD-10-CM | POA: Diagnosis not present

## 2023-01-03 DIAGNOSIS — F411 Generalized anxiety disorder: Secondary | ICD-10-CM | POA: Diagnosis not present

## 2023-01-03 DIAGNOSIS — S22000D Wedge compression fracture of unspecified thoracic vertebra, subsequent encounter for fracture with routine healing: Secondary | ICD-10-CM | POA: Diagnosis not present

## 2023-01-03 DIAGNOSIS — G44209 Tension-type headache, unspecified, not intractable: Secondary | ICD-10-CM | POA: Diagnosis not present

## 2023-01-03 DIAGNOSIS — Z6837 Body mass index (BMI) 37.0-37.9, adult: Secondary | ICD-10-CM | POA: Diagnosis not present

## 2023-02-21 DIAGNOSIS — Z23 Encounter for immunization: Secondary | ICD-10-CM | POA: Diagnosis not present

## 2023-03-20 DIAGNOSIS — R0602 Shortness of breath: Secondary | ICD-10-CM | POA: Diagnosis not present

## 2023-03-20 DIAGNOSIS — Z03818 Encounter for observation for suspected exposure to other biological agents ruled out: Secondary | ICD-10-CM | POA: Diagnosis not present

## 2023-03-20 DIAGNOSIS — R531 Weakness: Secondary | ICD-10-CM | POA: Diagnosis not present

## 2023-03-20 DIAGNOSIS — R051 Acute cough: Secondary | ICD-10-CM | POA: Diagnosis not present

## 2023-05-17 ENCOUNTER — Other Ambulatory Visit: Payer: Self-pay | Admitting: Internal Medicine

## 2023-05-17 NOTE — Telephone Encounter (Signed)
 Pt is requesting refill. Pt was last seen on 12-14-22, and the lov does not state anything regarding medication. Please advise.

## 2023-06-11 NOTE — Progress Notes (Signed)
 Megan Mcmahon, female    DOB: 04/30/1951   MRN: [id number]   Brief patient profile:  60 yowf never smoker with baseline wt 165-170 noted doe x 2002 when noted could not keep up with husband then covid x 10 Apr 2019 and Jan 2022 > never admitted but much worse p second episode so referred to pulmonary clinic in [place]  07/16/2020 by Dr  Megan Mcmahon with ? [facility] though Echo ok 07/02/20     History of Present Illness  07/16/2020  Pulmonary/ 1st office eval/ Megan Mcmahon / [place] [facility]  Chief Complaint  Patient presents with   Pulmonary Consult    Referred by Dr Debera. Pt c/o SOB for the past year.  She gets winded doing light housework. She also c/o non prod cough.   Dyspnea:  MMRC1 = can walk nl pace, flat grade, can't hurry or go uphills or steps s sob   Cough: min  Dry  Day > noct  Sleep: can't sleep on back even before covid now sleeping on side one side and bed is flat  SABA use:  Few years back with bronchitis seemed to help none since.  02 : has it, not using rec Stop fosamax  Pantoprazole  (protonix ) 40 mg   Take  30-60 min before first meal of the day and Pepcid  (famotidine )  20 mg one after supper  until return to office - this is the best way to tell whether stomach acid is contributing to your problem.      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 She has noticed occ wheezing.   Dyspnea:  Megan Mcmahon is as much as she's done, walking around on 3lpm did not check sats as rec  Cough: none  Sleeping: one pillow / flat bed / no cough noct  SABA use: none  02: 3lpm 24/7  Covid status vax x 3 x infection   Lung cancer screening: never smoker  Rec Make sure you check your oxygen  saturation  at your highest  level of activity  to be sure it stays over 90% and adjust  02 flow upward to maintain this level if needed but remember to turn it back to previous settings when you stop (to conserve your supply).  To get the most out of exercise, you need to be continuously aware that you are short of breath   06/08/2022  f/u ov/[name] office/Megan Mcmahon re: RHD paresis  maint on gerd rx   Chief Complaint  Patient presents with   Follow-up    Breathing improved slightly since last ov but some good days and some bad days  Purple finger tips today at ov    Dyspnea:  more active with housekeeping/ shopping pushing cart roses  Cough: none/ min sniffles  Sleeping: flat bed/ two pillows  SABA use: not using  02: 3lpm  Covid status: infected twice/ vax max  Rec I emphasized that nasal steroids (flonase/nasacort aq) have no immediate benefit  Make sure you check your oxygen  saturation at your highest level of activity(NOT after you stop)  to be sure it stays over 90%  Please schedule a follow up visit in 6 months but call sooner if needed  12/14/2022  56m f/u  f/u ov/Megan Mcmahon re: RHD paresis/02 dep maint on nasal steroids and 02   Chief Complaint  Patient presents with   Chronic respiratory failure with hypoxia and hypercapnia   Dyspnea:  [employer] store/ pushing cart on 3lpm sometimes 4lpm but not really titrating as rec  Cough: none  Sleeping: bed is flat/ 1 pillow no resp cc  SABA use: none  02: 3lpm hs / up to 4 when walk Rec No change in recommendations  Make sure you check your oxygen  saturation  AT  your highest level of activity (not after you stop)   to be sure it stays over 90%  Please schedule a follow up visit in 6  months but call sooner if needed    06/13/2023  f/u ov/Megan Mcmahon re: RHD paresis  maint on 02   Chief Complaint  Patient presents with   Follow-up    Follow up   Dyspnea:  washing dishes/ vacuum / no steps though  Cough:  throat clearing daytime, using  mints to suppress Sleeping: flat bed 2 pillows no    resp cc  SABA use: none  02: 3lpm sleeping/ 3lpm  up to 4 with activity     No obvious day to day or daytime variability or assoc excess/ purulent sputum or mucus plugs or hemoptysis or cp or chest tightness, subjective wheeze or overt sinus or hb symptoms.    Also denies any obvious fluctuation of symptoms with weather or environmental changes or other aggravating or alleviating factors except as outlined above   No unusual exposure hx or h/o childhood pna/ asthma or knowledge of premature birth.  Current Allergies, Complete Past Medical History, Past Surgical History, Family History, and Social History were reviewed in [employer] record.  ROS  The following are not active complaints unless bolded Hoarseness, sore throat, dysphagia, dental problems, itching, sneezing,  nasal congestion or discharge of excess mucus or purulent secretions, ear ache,   fever, chills, sweats, unintended wt loss or wt gain, classically pleuritic or exertional cp,  orthopnea pnd or arm/hand swelling  or leg swelling, presyncope, palpitations, abdominal pain, anorexia, nausea, vomiting, diarrhea  or change in bowel habits or change in bladder habits, change in stools or change in urine, dysuria, hematuria,  rash, arthralgias, visual complaints, headache, numbness, weakness or ataxia or problems with walking or coordination,  change in mood or  memory.        Current Meds  Medication Sig   alendronate (FOSAMAX) 70 MG tablet Take 70 mg by mouth once a week.   ALPRAZolam (XANAX) 0.5 MG tablet Take 0.5 mg by mouth 3 (three) times daily as needed for anxiety.   amitriptyline (ELAVIL) 25 MG tablet Take 25 mg by mouth at bedtime.   DULoxetine (CYMBALTA) 60 MG capsule Take 60 mg by mouth daily.   escitalopram (LEXAPRO) 10 MG tablet Take 10 mg by mouth daily as needed.   famotidine  (PEPCID ) 20 MG tablet TAKE 1 TABLET DAILY AFTER SUPPER    fluticasone (FLONASE) 50 MCG/ACT nasal spray Place 1 spray into both nostrils daily.   hydrochlorothiazide (HYDRODIURIL) 25 MG tablet Take 25 mg by mouth daily.   levothyroxine (SYNTHROID) 50 MCG tablet Take 50 mcg by mouth daily before breakfast.   metoprolol succinate (TOPROL-XL) 50 MG 24 hr tablet Take 75 mg by mouth daily. Take with or immediately following a meal.   pantoprazole  (PROTONIX ) 40 MG tablet Take 1 tablet (40 mg total) by  mouth daily. Take 30-60 min before first meal of the day   pravastatin (PRAVACHOL) 20 MG tablet Take 20 mg by mouth daily.   pregabalin (LYRICA) 75 MG capsule Take 75 mg by mouth 2 (two) times daily.   tolterodine (DETROL LA) 4 MG 24 hr capsule Take 4 mg by mouth daily.   traMADol (ULTRAM) 50 MG tablet Take by mouth. 1/2 tab 2-3X day              Past Medical History:  Diagnosis Date   Chronic kidney disease, stage 3 (HCC)    Fibromyalgia    GAD (generalized anxiety disorder)    GERD (gastroesophageal reflux disease)    Hyperlipidemia    Hypertension    Hypothyroidism          Objective:    Wts  06/13/2023         203  12/14/2022         201  06/08/2022       197 12/08/2021         200  06/09/2021         193 03/09/2021       198  11/18/2020       195   08/11/20 199 lb 3.2 oz (90.4 kg)  07/16/20 195 lb (88.5 kg)  06/22/20 197 lb (89.4 kg)    Vital signs reviewed  06/13/2023  - Note at rest 02 sats  98% on 3lpm cont    General appearance:    MO (by bmi) amb wf nad    HEENT : Oropharynx  clear      Nasal turbinates nl    NECK :  without  apparent JVD/ palpable Nodes/TM    LUNGS: no acc muscle use,  Nl contour chest which is clear to A and P bilaterally  x for slt decreased BS R base   CV:  RRR  no s3 or murmur or increase in P2, and no edema   ABD:  soft and nontender   MS:   ext warm without deformities Or obvious joint restrictions  calf tenderness, cyanosis or clubbing    SKIN: warm and dry without lesions    NEURO:  alert,  approp, nl sensorium with  no motor or cerebellar deficits apparent.           Assessment

## 2023-06-13 ENCOUNTER — Ambulatory Visit (INDEPENDENT_AMBULATORY_CARE_PROVIDER_SITE_OTHER): Payer: Medicare Other | Admitting: Internal Medicine

## 2023-06-13 ENCOUNTER — Encounter: Payer: Self-pay | Admitting: Internal Medicine

## 2023-06-13 VITALS — BP 136/68 | HR 78 | Ht <= 58 in | Wt 203.0 lb

## 2023-06-13 DIAGNOSIS — J9612 Chronic respiratory failure with hypercapnia: Secondary | ICD-10-CM

## 2023-06-13 DIAGNOSIS — R058 Other specified cough: Secondary | ICD-10-CM | POA: Diagnosis not present

## 2023-06-13 DIAGNOSIS — J9611 Chronic respiratory failure with hypoxia: Secondary | ICD-10-CM | POA: Diagnosis not present

## 2023-06-13 NOTE — Patient Instructions (Signed)
No mint or menthol products   Sugarless jolley ranchers or life savers are  best    Please schedule a follow up visit in 6  months but call sooner if needed

## 2023-06-14 NOTE — Assessment & Plan Note (Signed)
 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 Chronicity probably at least since 2019 with paresis on SNIFF 07/27/20 - HCO3  = 30  07/16/2020  - 07/16/2020 after desat 88% on 2lpm sat x 100 ft, o2 was increased to 3lpm and she walked an additional 243ft and sats at end 92% 3lpm/pt c/o fatigue and min SOB - ambulatory eval 03/09/21 desat to 77% on pulse 02 @ 4 lpm so rec cont 02 D/E tanks and adjust to keep > 90% - 06/09/2021 slow pace on 3LO2 cont. SOB p  Approx. 150 ft with lowest sats 92%  -12/08/2021 patients fingers were cold and pulse ox would not read consistently during walk. Patient titrated up to 5LO2 cont during walk because of SOB but could only do 150 ft slow pace  Rec  Make sure you check your oxygen  saturation  AT  your highest level of activity (not after you stop)   to be sure it stays over 90% and adjust  02 flow upward to maintain this level if needed but remember to turn it back to previous settings when you stop (to conserve your supply).

## 2023-06-14 NOTE — Assessment & Plan Note (Signed)
 Try off fosamax 07/16/2020 and max gerd rx > improved 08/11/2020   Back on fosamax 06/13/2023 and coughing again   Rec Consider yearly Reclast injections (we can arrrange in [place] infusion center if desired Continue gerd rx  Use hard rock candy to avoid throat clearing but nothing with mint/ menthol         Each maintenance medication was reviewed in detail including emphasizing most importantly the difference between maintenance and prns and under what circumstances the prns are to be triggered using an action plan format where appropriate.  Total time for H and P, chart review, counseling, reviewing 02/ pulse ox device(s) and generating customized AVS unique to this office visit / same day charting = 22

## 2023-06-27 DIAGNOSIS — E039 Hypothyroidism, unspecified: Secondary | ICD-10-CM | POA: Diagnosis not present

## 2023-06-27 DIAGNOSIS — E7801 Familial hypercholesterolemia: Secondary | ICD-10-CM | POA: Diagnosis not present

## 2023-06-27 DIAGNOSIS — E7800 Pure hypercholesterolemia, unspecified: Secondary | ICD-10-CM | POA: Diagnosis not present

## 2023-06-27 DIAGNOSIS — N183 Chronic kidney disease, stage 3 unspecified: Secondary | ICD-10-CM | POA: Diagnosis not present

## 2023-06-27 DIAGNOSIS — E781 Pure hyperglyceridemia: Secondary | ICD-10-CM | POA: Diagnosis not present

## 2023-06-27 DIAGNOSIS — R718 Other abnormality of red blood cells: Secondary | ICD-10-CM | POA: Diagnosis not present

## 2023-07-04 DIAGNOSIS — E039 Hypothyroidism, unspecified: Secondary | ICD-10-CM | POA: Diagnosis not present

## 2023-07-04 DIAGNOSIS — S22060A Wedge compression fracture of T7-T8 vertebra, initial encounter for closed fracture: Secondary | ICD-10-CM | POA: Diagnosis not present

## 2023-07-04 DIAGNOSIS — E6609 Other obesity due to excess calories: Secondary | ICD-10-CM | POA: Diagnosis not present

## 2023-07-04 DIAGNOSIS — E781 Pure hyperglyceridemia: Secondary | ICD-10-CM | POA: Diagnosis not present

## 2023-07-04 DIAGNOSIS — Z23 Encounter for immunization: Secondary | ICD-10-CM | POA: Diagnosis not present

## 2023-07-04 DIAGNOSIS — Z6836 Body mass index (BMI) 36.0-36.9, adult: Secondary | ICD-10-CM | POA: Diagnosis not present

## 2023-07-27 DIAGNOSIS — M545 Low back pain, unspecified: Secondary | ICD-10-CM | POA: Diagnosis not present

## 2023-07-27 DIAGNOSIS — Z6836 Body mass index (BMI) 36.0-36.9, adult: Secondary | ICD-10-CM | POA: Diagnosis not present

## 2023-12-17 NOTE — Progress Notes (Unsigned)
 Megan Mcmahon, female    DOB: 1951/02/08   MRN: [id number]   Brief patient profile:  50 yowf never smoker with baseline wt 165-170 noted doe x 2002 when noted could not keep up with husband then covid x 10 Apr 2019 and Jan 2022 > never admitted but much worse p second episode so referred to pulmonary clinic in [place]  07/16/2020 by Megan  Megan Mcmahon with ? [facility] though Echo ok 07/02/20     History of Present Illness  07/16/2020  Pulmonary/ 1st office eval/ Megan Mcmahon / [place] [facility]  Chief Complaint  Patient presents with   Pulmonary Consult    Referred by Megan Megan Mcmahon. Pt c/o SOB for the past year.  She gets winded doing light housework. She also c/o non prod cough.   Dyspnea:  MMRC1 = can walk nl pace, flat grade, can't hurry or go uphills or steps s sob   Cough: min  Dry  Day > noct  Sleep: can't sleep on back even before covid now sleeping on side one side and bed is flat  SABA use:  Few years back with bronchitis seemed to help none since.  02 : has it, not using rec Stop fosamax  Pantoprazole  (protonix ) 40 mg   Take  30-60 min before first meal of the day and Pepcid  (famotidine )  20 mg one after supper  until return to office - this is the best way to tell whether stomach acid is contributing to your problem.      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 She has noticed occ wheezing.   Dyspnea:  Megan Mcmahon is as much as she's done, walking around on 3lpm did not check sats as rec  Cough: none  Sleeping: one pillow / flat bed / no cough noct  SABA use: none  02: 3lpm 24/7  Covid status vax x 3 x infection   Lung cancer screening: never smoker  Rec Make sure you check your oxygen  saturation  at your highest  level of activity  to be sure it stays over 90% and adjust  02 flow upward to maintain this level if needed but remember to turn it back to previous settings when you stop (to conserve your supply).  To get the most out of exercise, you need to be continuously aware that you are short of breath   06/08/2022  f/u ov/[place] office/Megan Mcmahon re: RHD paresis  maint on gerd rx   Chief Complaint  Patient presents with   Follow-up    Breathing improved slightly since last ov but some good days and some bad days  Purple finger tips today at ov   Dyspnea:  more active with housekeeping/ shopping pushing cart roses  Cough: none/ min sniffles  Sleeping: flat bed/ two pillows  SABA use: not using  02: 3lpm  Covid status: infected twice/ vax max  Rec I emphasized that nasal steroids (flonase/nasacort aq) have no immediate benefit  Make sure you check your oxygen  saturation at your highest level of activity(NOT after you stop)  to be sure it stays over 90%  Please schedule a follow up visit in 6 months but call sooner if needed  12/14/2022  77m f/u  f/u ov/[place] office/Megan Mcmahon re: RHD paresis/02 dep maint on nasal steroids and 02   Chief Complaint  Patient presents with   Chronic respiratory failure with hypoxia and hypercapnia   Dyspnea:  dept store/ pushing cart on 3lpm sometimes 4lpm but not really titrating as rec  Cough: none  Sleeping: bed is flat/ 1 pillow no resp cc  SABA use: none  02: 3lpm hs / up to 4 when walk Rec No change in recommendations  Make sure you check your oxygen  saturation  AT  your highest level of activity (not after you stop)   to be sure it stays over 90%  Please schedule a follow up visit in 6  months but call sooner if needed    06/13/2023  f/u ov/[place] office/Megan Mcmahon re: RHD paresis  maint on 02   Chief Complaint  Patient presents with   Follow-up    Follow up   Dyspnea:  washing dishes/ vacuum / no steps though  Cough:  throat clearing daytime, using  mints to suppress Sleeping: flat bed 2 pillows no    resp cc  SABA use: none  02: 3lpm sleeping/ 3lpm  up to 4 with activity Rec No mint or menthol products  Sugarless jolley ranchers or life savers are  best  Please schedule a follow up visit in 6  months but call sooner if needed     12/19/2023  f/u ov/[place] office/Megan Mcmahon re: 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    Also denies any obvious fluctuation of symptoms with weather or environmental changes or other aggravating or alleviating factors except as outlined above   No unusual exposure hx or h/o childhood pna/ asthma or knowledge of premature birth.  Current Allergies, Complete Past Medical History, Past Surgical History, Family History, and Social History were reviewed in Megan Mcmahon [facility] record.  ROS  The following are not active complaints unless bolded Hoarseness, sore throat, dysphagia, dental problems, itching, sneezing,  nasal congestion or discharge of excess mucus or purulent secretions, ear ache,   fever, chills, sweats, unintended wt loss or wt gain, classically pleuritic or exertional cp,  orthopnea pnd or arm/hand swelling  or leg swelling, presyncope, palpitations, abdominal pain, anorexia, nausea, vomiting, diarrhea  or change in bowel habits or change in bladder habits, change in stools or change in urine, dysuria, hematuria,   rash, arthralgias, visual complaints, headache, numbness, weakness or ataxia or problems with walking or coordination,  change in mood or  memory.        Current Meds  Medication Sig   alendronate (FOSAMAX) 70 MG tablet Take 70 mg by mouth once a week.   ALPRAZolam (XANAX) 0.5 MG tablet Take 0.5 mg by mouth 3 (three) times daily as needed for anxiety.  amitriptyline (ELAVIL) 25 MG tablet Take 25 mg by mouth at bedtime.   DULoxetine (CYMBALTA) 60 MG capsule Take 60 mg by mouth daily.   famotidine  (PEPCID ) 20 MG tablet TAKE 1 TABLET DAILY AFTER SUPPER   fluticasone (FLONASE) 50 MCG/ACT nasal spray Place 1 spray into both nostrils daily.   hydrochlorothiazide (HYDRODIURIL) 25 MG tablet Take 25 mg by mouth daily.   levothyroxine (SYNTHROID) 50 MCG tablet Take 50 mcg by mouth daily before breakfast.   metoprolol succinate (TOPROL-XL) 50 MG 24 hr tablet Take 75 mg by mouth daily. Take with or immediately following a meal.   pravastatin (PRAVACHOL) 20 MG tablet Take 20 mg by mouth daily.   pregabalin (LYRICA) 75 MG capsule Take 75 mg by mouth 2 (two) times daily.   tolterodine (DETROL LA) 4 MG 24 hr capsule Take 4 mg by mouth daily.   traMADol (ULTRAM) 50 MG tablet Take by mouth. 1/2 tab 2-3X day               Past Medical History:  Diagnosis Date   Chronic kidney disease, stage 3 (HCC)    Fibromyalgia    GAD (generalized anxiety disorder)    GERD (gastroesophageal reflux disease)    Hyperlipidemia    Hypertension    Hypothyroidism          Objective:    Wts  12/19/2023       202  06/13/2023         203  12/14/2022         201  06/08/2022       197 12/08/2021         200  06/09/2021         193 03/09/2021       198  11/18/2020       195   08/11/20 199 lb 3.2 oz (90.4 kg)  07/16/20 195 lb (88.5 kg)  06/22/20 197 lb (89.4 kg)     Vital signs reviewed  12/19/2023  - Note at rest 02 sats  88% on 3lpm cont    General appearance:    MO (by BMI) w/c  wf nad    HEENT : Oropharynx   clear      Nasal turbinates nl    NECK :  without  apparent JVD/ palpable Nodes/TM    LUNGS: no acc muscle use,  Nl contour chest with insp crackles in bases but no insp cough/ decreased bs R Base   CV:  RRR  no s3 or murmur or increase in P2, and no edema   ABD: obese  soft and nontender   MS:    ext warm without deformities Or obvious joint restrictions  calf tenderness, cyanosis or clubbing    SKIN: warm and dry without lesions    NEURO:  alert, approp, nl sensorium with  no motor or cerebellar deficits apparent.       CXR PA and Lateral:   12/19/2023 :    I personally reviewed images and impression is as follows:     Mod kyphosis/ lots of intestinal air under elevated R HD No acute findings    Assessment   Assessment & Plan Chronic respiratory failure with hypoxia and hypercapnia (HCC) Onset of sob 2002 assoc with elevated R HD ? Chronicity probably at least since 2019 with paresis on SNIFF 07/27/20 - HCO3  = 30  07/16/2020  - 07/16/2020 after desat 88% on 2lpm sat x 100 ft, o2 was increased to 3lpm  and she walked an additional 268ft and sats at end 92% 3lpm/pt c/o fatigue and min SOB - ambulatory eval 03/09/21 desat to 77% on pulse 02 @ 4 lpm so rec cont 02 D/E tanks and adjust to keep > 90% - 06/09/2021 slow pace on 3LO2 cont. SOB p  Approx. 150 ft with lowest sats 92%  -12/08/2021 patients fingers were cold and pulse ox would not read consistently during walk. Patient titrated up to 5LO2 cont during walk because of SOB but could only do 150 ft slow pace  >>> as of 12/19/2023 using 3lpm x up to 4lpm with acitivity continuous but not titrating  Advised goas is > 90% at all times - see avs for instructions unique to this ov  Upper airway cough syndrome Try off fosamax 07/16/2020 and max gerd rx > improved 08/11/2020   >>>Rec max gerd rx/ hard rock candy to resis urge to clear throat  DOE (dyspnea on exertion) Never smoker/ onset around 2002 then covid Dec 2020 and Jan 2021 neither  required admit  - Echo ok 07/02/20 ok  - 02 rx started 07/16/2020  (see separate a/p)  - marked elevation of R HD 07/16/2020   > 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 Basilar predominant patchy volume loss, mild bronchiectasis and mild coarsening, findings which may be postinfectious/postinflammatory in etiology,   2. Small to borderline enlarged mediastinal lymph nodes may be reactive. 3. T7 and T8 compression fractures are new from 03/09/2021. 4. Enlarged pulmonic trunk, indicative of pulmonary arterial hypertension.  C/w MO, RHD paresis, pos covid PF but unfortunately nothing to offer other than encourage mobility / approp 02 and wt loss  Morbid obesity due to excess calories (HCC) Body mass index is 43.76 kg/m.  -  trending no change  Lab Results  Component Value Date   TSH 1.798 07/16/2020      Contributing to doe and risk of GERD/dvt/ pe >>>   reviewed the need and the process to achieve and maintain neg calorie balance > defer f/u primary care including intermittently monitoring thyroid  status           Each maintenance medication was reviewed in detail including emphasizing most importantly the difference between maintenance and prns and under what circumstances the prns are to be triggered using an action plan format where appropriate.  Total time for H and P, chart review, counseling, reviewing 02/pulse ox  device(s) and generating customized AVS unique to this office visit / same day charting = 41 min  for multiple  chronic refractory respiratory  symptoms                Patient Instructions  Make sure you check your oxygen  saturation  AT  your highest level of activity (not after you stop)   to be sure it stays over 90% and adjust  02 flow upward to maintain this level if needed but remember to turn it back to previous settings when you stop (to conserve your supply).     Please schedule a follow up visit in  12 months but call sooner if needed    Ozell America, MD 12/19/2023

## 2023-12-19 ENCOUNTER — Ambulatory Visit (INDEPENDENT_AMBULATORY_CARE_PROVIDER_SITE_OTHER): Admitting: Internal Medicine

## 2023-12-19 ENCOUNTER — Ambulatory Visit (HOSPITAL_COMMUNITY)
Admission: RE | Admit: 2023-12-19 | Discharge: 2023-12-19 | Disposition: A | Discharge status: Home / Self Care | Source: Ambulatory Visit | Attending: Internal Medicine | Admitting: Internal Medicine

## 2023-12-19 ENCOUNTER — Encounter: Payer: Self-pay | Admitting: Internal Medicine

## 2023-12-19 VITALS — BP 124/85 | HR 82 | Ht <= 58 in | Wt 202.2 lb

## 2023-12-19 DIAGNOSIS — R0602 Shortness of breath: Secondary | ICD-10-CM | POA: Diagnosis not present

## 2023-12-19 DIAGNOSIS — J9611 Chronic respiratory failure with hypoxia: Secondary | ICD-10-CM | POA: Diagnosis not present

## 2023-12-19 DIAGNOSIS — R0989 Other specified symptoms and signs involving the circulatory and respiratory systems: Secondary | ICD-10-CM | POA: Diagnosis not present

## 2023-12-19 DIAGNOSIS — J984 Other disorders of lung: Secondary | ICD-10-CM | POA: Diagnosis not present

## 2023-12-19 DIAGNOSIS — J9612 Chronic respiratory failure with hypercapnia: Secondary | ICD-10-CM

## 2023-12-19 DIAGNOSIS — R918 Other nonspecific abnormal finding of lung field: Secondary | ICD-10-CM | POA: Diagnosis not present

## 2023-12-19 DIAGNOSIS — R0609 Other forms of dyspnea: Secondary | ICD-10-CM

## 2023-12-19 DIAGNOSIS — Z6841 Body Mass Index (BMI) 40.0 and over, adult: Secondary | ICD-10-CM

## 2023-12-19 DIAGNOSIS — R058 Other specified cough: Secondary | ICD-10-CM

## 2023-12-19 NOTE — Patient Instructions (Signed)
Make sure you check your oxygen saturation  AT  your highest level of activity (not after you stop)   to be sure it stays over 90% and adjust  02 flow upward to maintain this level if needed but remember to turn it back to previous settings when you stop (to conserve your supply).  ° ° °Please schedule a follow up visit in 12 months but call sooner if needed  °

## 2023-12-19 NOTE — Assessment & Plan Note (Addendum)
 Body mass index is 43.76 kg/m.  -  trending no change  Lab Results  Component Value Date   TSH 1.798 07/16/2020      Contributing to doe and risk of GERD/dvt/ pe >>>   reviewed the need and the process to achieve and maintain neg calorie balance > defer f/u primary care including intermittently monitoring thyroid  status           Each maintenance medication was reviewed in detail including emphasizing most importantly the difference between maintenance and prns and under what circumstances the prns are to be triggered using an action plan format where appropriate.  Total time for H and P, chart review, counseling, reviewing 02/pulse ox  device(s) and generating customized AVS unique to this office visit / same day charting = 41 min  for multiple  chronic refractory respiratory  symptoms

## 2023-12-19 NOTE — Assessment & Plan Note (Addendum)
 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 Chronicity probably at least since 2019 with paresis on SNIFF 07/27/20 - HCO3  = 30  07/16/2020  - 07/16/2020 after desat 88% on 2lpm sat x 100 ft, o2 was increased to 3lpm and she walked an additional 2104ft and sats at end 92% 3lpm/pt c/o fatigue and min SOB - ambulatory eval 03/09/21 desat to 77% on pulse 02 @ 4 lpm so rec cont 02 D/E tanks and adjust to keep > 90% - 06/09/2021 slow pace on 3LO2 cont. SOB p  Approx. 150 ft with lowest sats 92%  -12/08/2021 patients fingers were cold and pulse ox would not read consistently during walk. Patient titrated up to 5LO2 cont during walk because of SOB but could only do 150 ft slow pace  >>> as of 12/19/2023 using 3lpm x up to 4lpm with acitivity continuous but not titrating  Advised goas is > 90% at all times - see avs for instructions unique to this ov

## 2023-12-19 NOTE — Assessment & Plan Note (Addendum)
 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 Basilar predominant patchy volume loss, mild bronchiectasis and mild coarsening, findings which may be postinfectious/postinflammatory in etiology,   2. Small to borderline enlarged mediastinal lymph nodes may be reactive. 3. T7 and T8 compression fractures are new from 03/09/2021. 4. Enlarged pulmonic trunk, indicative of pulmonary arterial hypertension.  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

## 2023-12-19 NOTE — Assessment & Plan Note (Addendum)
 Try off fosamax 07/16/2020 and max gerd rx > improved 08/11/2020   >>>Rec max gerd rx/ hard rock candy to resis urge to clear throat

## 2023-12-22 ENCOUNTER — Ambulatory Visit: Payer: Self-pay | Admitting: Internal Medicine

## 2023-12-25 NOTE — Progress Notes (Signed)
 Spoke with pt regarding her cxr, pt confirmed understanding nfn

## 2023-12-26 DIAGNOSIS — E7849 Other hyperlipidemia: Secondary | ICD-10-CM | POA: Diagnosis not present

## 2023-12-26 DIAGNOSIS — E039 Hypothyroidism, unspecified: Secondary | ICD-10-CM | POA: Diagnosis not present

## 2023-12-26 DIAGNOSIS — N183 Chronic kidney disease, stage 3 unspecified: Secondary | ICD-10-CM | POA: Diagnosis not present

## 2023-12-26 DIAGNOSIS — R718 Other abnormality of red blood cells: Secondary | ICD-10-CM | POA: Diagnosis not present

## 2023-12-26 DIAGNOSIS — E781 Pure hyperglyceridemia: Secondary | ICD-10-CM | POA: Diagnosis not present

## 2024-01-12 DIAGNOSIS — E039 Hypothyroidism, unspecified: Secondary | ICD-10-CM | POA: Diagnosis not present

## 2024-01-12 DIAGNOSIS — M545 Low back pain, unspecified: Secondary | ICD-10-CM | POA: Diagnosis not present

## 2024-01-12 DIAGNOSIS — Z6837 Body mass index (BMI) 37.0-37.9, adult: Secondary | ICD-10-CM | POA: Diagnosis not present

## 2024-01-12 DIAGNOSIS — Z23 Encounter for immunization: Secondary | ICD-10-CM | POA: Diagnosis not present

## 2024-01-12 DIAGNOSIS — E6609 Other obesity due to excess calories: Secondary | ICD-10-CM | POA: Diagnosis not present

## 2024-01-12 DIAGNOSIS — M810 Age-related osteoporosis without current pathological fracture: Secondary | ICD-10-CM | POA: Diagnosis not present

## 2024-01-23 DIAGNOSIS — Z23 Encounter for immunization: Secondary | ICD-10-CM | POA: Diagnosis not present

## 2024-03-01 DIAGNOSIS — Z23 Encounter for immunization: Secondary | ICD-10-CM | POA: Diagnosis not present

## 2024-03-09 ENCOUNTER — Other Ambulatory Visit: Payer: Self-pay | Admitting: Internal Medicine

## 2024-03-11 NOTE — Telephone Encounter (Signed)
 Pt is requesting refill for Pepcid . Last seen  12/19/2023 nothing listen mentions Pepcid . Please advise.

## 2024-05-20 ENCOUNTER — Emergency Department (HOSPITAL_COMMUNITY)

## 2024-05-20 ENCOUNTER — Encounter (HOSPITAL_COMMUNITY): Payer: Self-pay | Admitting: Emergency Medicine

## 2024-05-20 ENCOUNTER — Other Ambulatory Visit: Payer: Self-pay

## 2024-05-20 ENCOUNTER — Inpatient Hospital Stay (HOSPITAL_COMMUNITY)
Admission: EM | Admit: 2024-05-20 | Discharge: 2024-05-27 | DRG: 481 | Disposition: A | Discharge status: Skilled Nursing Facility | Attending: Internal Medicine | Admitting: Internal Medicine

## 2024-05-20 DIAGNOSIS — S7290XA Unspecified fracture of unspecified femur, initial encounter for closed fracture: Secondary | ICD-10-CM | POA: Diagnosis present

## 2024-05-20 DIAGNOSIS — W1830XA Fall on same level, unspecified, initial encounter: Secondary | ICD-10-CM | POA: Diagnosis present

## 2024-05-20 DIAGNOSIS — M80052A Age-related osteoporosis with current pathological fracture, left femur, initial encounter for fracture: Secondary | ICD-10-CM | POA: Diagnosis present

## 2024-05-20 DIAGNOSIS — Y92009 Unspecified place in unspecified non-institutional (private) residence as the place of occurrence of the external cause: Secondary | ICD-10-CM | POA: Diagnosis not present

## 2024-05-20 DIAGNOSIS — Z8249 Family history of ischemic heart disease and other diseases of the circulatory system: Secondary | ICD-10-CM

## 2024-05-20 DIAGNOSIS — D72829 Elevated white blood cell count, unspecified: Secondary | ICD-10-CM | POA: Diagnosis present

## 2024-05-20 DIAGNOSIS — Z6841 Body Mass Index (BMI) 40.0 and over, adult: Secondary | ICD-10-CM | POA: Diagnosis not present

## 2024-05-20 DIAGNOSIS — N3281 Overactive bladder: Secondary | ICD-10-CM | POA: Diagnosis present

## 2024-05-20 DIAGNOSIS — I129 Hypertensive chronic kidney disease with stage 1 through stage 4 chronic kidney disease, or unspecified chronic kidney disease: Secondary | ICD-10-CM | POA: Diagnosis present

## 2024-05-20 DIAGNOSIS — J9611 Chronic respiratory failure with hypoxia: Secondary | ICD-10-CM | POA: Diagnosis present

## 2024-05-20 DIAGNOSIS — K219 Gastro-esophageal reflux disease without esophagitis: Secondary | ICD-10-CM | POA: Diagnosis present

## 2024-05-20 DIAGNOSIS — D62 Acute posthemorrhagic anemia: Secondary | ICD-10-CM | POA: Diagnosis not present

## 2024-05-20 DIAGNOSIS — Z79890 Hormone replacement therapy (postmenopausal): Secondary | ICD-10-CM | POA: Diagnosis not present

## 2024-05-20 DIAGNOSIS — N1831 Chronic kidney disease, stage 3a: Secondary | ICD-10-CM | POA: Diagnosis present

## 2024-05-20 DIAGNOSIS — M810 Age-related osteoporosis without current pathological fracture: Secondary | ICD-10-CM | POA: Diagnosis present

## 2024-05-20 DIAGNOSIS — E785 Hyperlipidemia, unspecified: Secondary | ICD-10-CM | POA: Diagnosis present

## 2024-05-20 DIAGNOSIS — U099 Post covid-19 condition, unspecified: Secondary | ICD-10-CM | POA: Diagnosis present

## 2024-05-20 DIAGNOSIS — I10 Essential (primary) hypertension: Secondary | ICD-10-CM | POA: Diagnosis not present

## 2024-05-20 DIAGNOSIS — S72142A Displaced intertrochanteric fracture of left femur, initial encounter for closed fracture: Secondary | ICD-10-CM | POA: Diagnosis present

## 2024-05-20 DIAGNOSIS — G894 Chronic pain syndrome: Secondary | ICD-10-CM | POA: Diagnosis present

## 2024-05-20 DIAGNOSIS — S72002A Fracture of unspecified part of neck of left femur, initial encounter for closed fracture: Secondary | ICD-10-CM | POA: Diagnosis present

## 2024-05-20 DIAGNOSIS — Z885 Allergy status to narcotic agent status: Secondary | ICD-10-CM

## 2024-05-20 DIAGNOSIS — M797 Fibromyalgia: Secondary | ICD-10-CM | POA: Diagnosis present

## 2024-05-20 DIAGNOSIS — W19XXXA Unspecified fall, initial encounter: Principal | ICD-10-CM

## 2024-05-20 DIAGNOSIS — J9612 Chronic respiratory failure with hypercapnia: Secondary | ICD-10-CM | POA: Diagnosis present

## 2024-05-20 DIAGNOSIS — F411 Generalized anxiety disorder: Secondary | ICD-10-CM | POA: Diagnosis present

## 2024-05-20 DIAGNOSIS — Z7983 Long term (current) use of bisphosphonates: Secondary | ICD-10-CM | POA: Diagnosis not present

## 2024-05-20 DIAGNOSIS — M25552 Pain in left hip: Secondary | ICD-10-CM | POA: Diagnosis not present

## 2024-05-20 DIAGNOSIS — J986 Disorders of diaphragm: Secondary | ICD-10-CM | POA: Diagnosis present

## 2024-05-20 DIAGNOSIS — Z9981 Dependence on supplemental oxygen: Secondary | ICD-10-CM | POA: Diagnosis not present

## 2024-05-20 DIAGNOSIS — S72352A Displaced comminuted fracture of shaft of left femur, initial encounter for closed fracture: Secondary | ICD-10-CM | POA: Diagnosis not present

## 2024-05-20 DIAGNOSIS — E039 Hypothyroidism, unspecified: Secondary | ICD-10-CM | POA: Diagnosis present

## 2024-05-20 DIAGNOSIS — Z79899 Other long term (current) drug therapy: Secondary | ICD-10-CM

## 2024-05-20 DIAGNOSIS — Z91048 Other nonmedicinal substance allergy status: Secondary | ICD-10-CM

## 2024-05-20 DIAGNOSIS — Z823 Family history of stroke: Secondary | ICD-10-CM

## 2024-05-20 DIAGNOSIS — Z743 Need for continuous supervision: Secondary | ICD-10-CM | POA: Diagnosis not present

## 2024-05-20 DIAGNOSIS — M7989 Other specified soft tissue disorders: Secondary | ICD-10-CM | POA: Diagnosis not present

## 2024-05-20 DIAGNOSIS — Z888 Allergy status to other drugs, medicaments and biological substances status: Secondary | ICD-10-CM

## 2024-05-20 DIAGNOSIS — F32A Depression, unspecified: Secondary | ICD-10-CM | POA: Diagnosis present

## 2024-05-20 HISTORY — DX: Post covid-19 condition, unspecified: U09.9

## 2024-05-20 LAB — CBC WITH DIFFERENTIAL/PLATELET
Abs Immature Granulocytes: 0.11 K/uL — ABNORMAL HIGH (ref 0.00–0.07)
Basophils Absolute: 0 K/uL (ref 0.0–0.1)
Basophils Relative: 0 %
Eosinophils Absolute: 0.1 K/uL (ref 0.0–0.5)
Eosinophils Relative: 0 %
HCT: 37.9 % (ref 36.0–46.0)
Hemoglobin: 11.9 g/dL — ABNORMAL LOW (ref 12.0–15.0)
Immature Granulocytes: 1 %
Lymphocytes Relative: 6 %
Lymphs Abs: 1.1 K/uL (ref 0.7–4.0)
MCH: 28.7 pg (ref 26.0–34.0)
MCHC: 31.4 g/dL (ref 30.0–36.0)
MCV: 91.3 fL (ref 80.0–100.0)
Monocytes Absolute: 1.3 K/uL — ABNORMAL HIGH (ref 0.1–1.0)
Monocytes Relative: 6 %
Neutro Abs: 17.5 K/uL — ABNORMAL HIGH (ref 1.7–7.7)
Neutrophils Relative %: 87 %
Platelets: 197 K/uL (ref 150–400)
RBC: 4.15 MIL/uL (ref 3.87–5.11)
RDW: 13.3 % (ref 11.5–15.5)
WBC: 20.1 K/uL — ABNORMAL HIGH (ref 4.0–10.5)
nRBC: 0 % (ref 0.0–0.2)

## 2024-05-20 LAB — BASIC METABOLIC PANEL WITH GFR
Anion gap: 12 (ref 5–15)
BUN: 10 mg/dL (ref 8–23)
CO2: 33 mmol/L — ABNORMAL HIGH (ref 22–32)
Calcium: 10 mg/dL (ref 8.9–10.3)
Chloride: 90 mmol/L — ABNORMAL LOW (ref 98–111)
Creatinine, Ser: 0.73 mg/dL (ref 0.44–1.00)
GFR, Estimated: >60 mL/min
Glucose, Bld: 115 mg/dL — ABNORMAL HIGH (ref 70–99)
Potassium: 3.8 mmol/L (ref 3.5–5.1)
Sodium: 135 mmol/L (ref 135–145)

## 2024-05-20 LAB — TYPE AND SCREEN
ABO/RH(D): O POS
ABO/RH(D): O POS
Antibody Screen: NEGATIVE
Antibody Screen: NEGATIVE

## 2024-05-20 LAB — PROTIME-INR
INR: 0.9 (ref 0.8–1.2)
Prothrombin Time: 12.5 s (ref 11.4–15.2)

## 2024-05-20 MED ORDER — TRAZODONE HCL 50 MG PO TABS: 50.0000 mg | ORAL_TABLET | Freq: Every evening | ORAL | Status: DC | PRN

## 2024-05-20 MED ORDER — HEPARIN SODIUM (PORCINE) 5000 UNIT/ML IJ SOLN
5000.0000 [IU] | Freq: Three times a day (TID) | INTRAMUSCULAR | Status: DC
  Administered 2024-05-20: 5000 [IU] via SUBCUTANEOUS
  Filled 2024-05-20: qty 1

## 2024-05-20 MED ORDER — SODIUM CHLORIDE 0.9% FLUSH
3.0000 mL | Freq: Two times a day (BID) | INTRAVENOUS | Status: DC
  Administered 2024-05-21 – 2024-05-27 (×11): 3 mL via INTRAVENOUS

## 2024-05-20 MED ORDER — PRAVASTATIN SODIUM 10 MG PO TABS
20.0000 mg | ORAL_TABLET | Freq: Every day | ORAL | Status: DC
  Administered 2024-05-20 – 2024-05-26 (×7): 20 mg via ORAL
  Filled 2024-05-20 (×4): qty 2
  Filled 2024-05-20: qty 1
  Filled 2024-05-20 (×3): qty 2

## 2024-05-20 MED ORDER — LEVOTHYROXINE SODIUM 50 MCG PO TABS
50.0000 ug | ORAL_TABLET | Freq: Every day | ORAL | Status: DC
  Administered 2024-05-20 – 2024-05-27 (×7): 50 ug via ORAL
  Filled 2024-05-20 (×7): qty 1

## 2024-05-20 MED ORDER — SODIUM CHLORIDE 0.9% FLUSH: 3.0000 mL | INTRAVENOUS | Status: DC | PRN

## 2024-05-20 MED ORDER — METOPROLOL SUCCINATE ER 50 MG PO TB24
75.0000 mg | ORAL_TABLET | Freq: Every day | ORAL | Status: DC
  Administered 2024-05-23 – 2024-05-27 (×5): 75 mg via ORAL
  Filled 2024-05-20 (×7): qty 1

## 2024-05-20 MED ORDER — HEPARIN SODIUM (PORCINE) 5000 UNIT/ML IJ SOLN: 5000.0000 [IU] | Freq: Three times a day (TID) | INTRAMUSCULAR | Status: DC

## 2024-05-20 MED ORDER — HYDROMORPHONE HCL 1 MG/ML IJ SOLN
0.5000 mg | INTRAMUSCULAR | Status: DC | PRN
  Filled 2024-05-20: qty 0.5

## 2024-05-20 MED ORDER — HYDROMORPHONE HCL 1 MG/ML IJ SOLN
1.0000 mg | INTRAMUSCULAR | Status: DC | PRN
  Administered 2024-05-20 – 2024-05-26 (×5): 1 mg via INTRAVENOUS
  Filled 2024-05-20 (×5): qty 1

## 2024-05-20 MED ORDER — OXYCODONE-ACETAMINOPHEN 5-325 MG PO TABS
1.0000 | ORAL_TABLET | Freq: Once | ORAL | Status: AC
  Administered 2024-05-20: 1 via ORAL
  Filled 2024-05-20: qty 1

## 2024-05-20 MED ORDER — SODIUM CHLORIDE 0.9% FLUSH
3.0000 mL | Freq: Two times a day (BID) | INTRAVENOUS | Status: DC
  Administered 2024-05-20 – 2024-05-26 (×10): 3 mL via INTRAVENOUS

## 2024-05-20 MED ORDER — TRAMADOL HCL 50 MG PO TABS
50.0000 mg | ORAL_TABLET | Freq: Four times a day (QID) | ORAL | Status: DC | PRN
  Administered 2024-05-20: 50 mg via ORAL
  Filled 2024-05-20: qty 1

## 2024-05-20 MED ORDER — POLYETHYLENE GLYCOL 3350 17 G PO PACK: 17.0000 g | PACK | Freq: Every day | ORAL | Status: DC | PRN

## 2024-05-20 MED ORDER — METHOCARBAMOL 500 MG PO TABS
500.0000 mg | ORAL_TABLET | Freq: Three times a day (TID) | ORAL | Status: AC
  Administered 2024-05-20 – 2024-05-23 (×10): 500 mg via ORAL
  Filled 2024-05-20 (×11): qty 1

## 2024-05-20 MED ORDER — OXYCODONE HCL 5 MG PO TABS
5.0000 mg | ORAL_TABLET | ORAL | Status: DC | PRN
  Administered 2024-05-21 – 2024-05-27 (×7): 5 mg via ORAL
  Filled 2024-05-20 (×7): qty 1

## 2024-05-20 MED ORDER — SODIUM CHLORIDE 0.9 % IV SOLN: INTRAVENOUS | Status: AC | PRN

## 2024-05-20 MED ORDER — ONDANSETRON HCL 4 MG PO TABS: 4.0000 mg | ORAL_TABLET | Freq: Four times a day (QID) | ORAL | Status: DC | PRN

## 2024-05-20 MED ORDER — FESOTERODINE FUMARATE ER 4 MG PO TB24
4.0000 mg | ORAL_TABLET | Freq: Every day | ORAL | Status: DC
  Administered 2024-05-20 – 2024-05-27 (×6): 4 mg via ORAL
  Filled 2024-05-20 (×9): qty 1

## 2024-05-20 MED ORDER — ONDANSETRON HCL 4 MG/2ML IJ SOLN
4.0000 mg | Freq: Four times a day (QID) | INTRAMUSCULAR | Status: DC | PRN
  Administered 2024-05-25: 4 mg via INTRAVENOUS
  Filled 2024-05-20: qty 2

## 2024-05-20 MED ORDER — HEPARIN SODIUM (PORCINE) 5000 UNIT/ML IJ SOLN
5000.0000 [IU] | Freq: Three times a day (TID) | INTRAMUSCULAR | Status: DC
  Administered 2024-05-22 – 2024-05-26 (×12): 5000 [IU] via SUBCUTANEOUS
  Filled 2024-05-20 (×12): qty 1

## 2024-05-20 MED ORDER — ACETAMINOPHEN 500 MG PO TABS
1000.0000 mg | ORAL_TABLET | Freq: Once | ORAL | Status: AC
  Administered 2024-05-21: 1000 mg via ORAL
  Filled 2024-05-20: qty 2

## 2024-05-20 MED ORDER — ALPRAZOLAM 0.5 MG PO TABS
0.5000 mg | ORAL_TABLET | Freq: Once | ORAL | Status: AC | PRN
  Administered 2024-05-24: 0.5 mg via ORAL
  Filled 2024-05-20: qty 1

## 2024-05-20 MED ORDER — HYDROMORPHONE HCL 1 MG/ML IJ SOLN: 0.5000 mg | INTRAMUSCULAR | Status: DC | PRN

## 2024-05-20 MED ORDER — ONDANSETRON HCL 4 MG/2ML IJ SOLN
4.0000 mg | Freq: Once | INTRAMUSCULAR | Status: DC
  Filled 2024-05-20: qty 2

## 2024-05-20 MED ORDER — ACETAMINOPHEN 325 MG PO TABS
650.0000 mg | ORAL_TABLET | Freq: Four times a day (QID) | ORAL | Status: DC | PRN
  Administered 2024-05-26: 650 mg via ORAL
  Filled 2024-05-20: qty 2

## 2024-05-20 MED ORDER — HYDROMORPHONE HCL 1 MG/ML IJ SOLN
1.0000 mg | INTRAMUSCULAR | Status: DC | PRN
  Administered 2024-05-20: 1 mg via INTRAMUSCULAR
  Filled 2024-05-20 (×2): qty 1

## 2024-05-20 MED ORDER — BISACODYL 10 MG RE SUPP: 10.0000 mg | Freq: Every day | RECTAL | Status: DC | PRN

## 2024-05-20 MED ORDER — HEPARIN SODIUM (PORCINE) 5000 UNIT/ML IJ SOLN
5000.0000 [IU] | Freq: Once | INTRAMUSCULAR | Status: DC
  Filled 2024-05-20: qty 1

## 2024-05-20 MED ORDER — DULOXETINE HCL 60 MG PO CPEP
60.0000 mg | ORAL_CAPSULE | Freq: Every day | ORAL | Status: DC
  Administered 2024-05-20 – 2024-05-27 (×6): 60 mg via ORAL
  Filled 2024-05-20 (×6): qty 1
  Filled 2024-05-20: qty 2

## 2024-05-20 MED ORDER — HYDRALAZINE HCL 20 MG/ML IJ SOLN: 10.0000 mg | INTRAMUSCULAR | Status: DC | PRN

## 2024-05-20 MED ORDER — PANTOPRAZOLE SODIUM 40 MG PO TBEC
40.0000 mg | DELAYED_RELEASE_TABLET | Freq: Every day | ORAL | Status: DC
  Administered 2024-05-20 – 2024-05-27 (×6): 40 mg via ORAL
  Filled 2024-05-20 (×7): qty 1

## 2024-05-20 MED ORDER — KETOROLAC TROMETHAMINE 30 MG/ML IJ SOLN
30.0000 mg | Freq: Once | INTRAMUSCULAR | Status: AC
  Administered 2024-05-20: 30 mg via INTRAMUSCULAR
  Filled 2024-05-20: qty 1

## 2024-05-20 MED ORDER — ACETAMINOPHEN 650 MG RE SUPP: 650.0000 mg | Freq: Four times a day (QID) | RECTAL | Status: DC | PRN

## 2024-05-20 NOTE — ED Notes (Signed)
 [facility] received report and en route, ETA of 10 minutes.

## 2024-05-20 NOTE — ED Triage Notes (Signed)
 Pt bib EMS after falling at home and landing on L hip. Per EMS, pt has obvious deformity to L hip with shortening and rotation to L hip. EMS also reportsd swelling to L hip. EMS administered 50mcg Fentanyl  IM en route for pain of 10/10 L hip. Dr Garrison at bedside.

## 2024-05-20 NOTE — TOC CM/SW Note (Signed)
 Transition of Care [facility]) - Inpatient Brief Assessment   Patient Details  Name: Megan Mcmahon MRN: [id number] Date of Birth: [birth year]  [facility]) CM/SW Contact:    Lucie Lunger, LCSWA Phone Number: 05/20/2024, 9:03 AM   Clinical Narrative: Transition of Care Department [facility]) has reviewed patient and no TOC needs have been identified at this time. We will continue to monitor patient advancement through interdiciplinary progression rounds. If new patient transition needs arise, please place a TOC consult.  Transition of Care Asessment: Insurance and Status: Insurance coverage has been reviewed Patient has primary care physician: Yes Home environment has been reviewed: From home Prior level of function:: Independent Prior/Current Home Services: No current home services Social Drivers of Health Review: SDOH reviewed no interventions necessary Readmission risk has been reviewed: Yes Transition of care needs: no transition of care needs at this time

## 2024-05-20 NOTE — ED Notes (Signed)
 EDP notified of inability to obtain IV access.

## 2024-05-20 NOTE — ED Notes (Signed)
 Checked pt and bed to see if they were dry because pt hasn't urinated yet.  Pt and bed were both dry.

## 2024-05-20 NOTE — Progress Notes (Signed)
" ° ° °  PROCEDURAL EXPEDITER PROGRESS NOTE  Patient Name: Megan Mcmahon  DOB:August 12, 1950 Date of Admission: 05/20/2024  Date of Assessment:05/20/2024   -------------------------------------------------------------------------------------------------------------------   Brief clinical summary: pt is 74 yr old female who fell and fracted her hip.  Having surgery tomoorros 05/22/2027  Orders in place:  Yes   Communication with surgical team if no orders: n/a  Labs, test, and orders reviewed: Y  Requires surgical clearance:  No  What type of clearance: n/a  Clearance received: n/a  Barriers noted:n/a n/a Intervention provided by [facility] team: n/a  Barrier resolved:  not applicable   -------------------------------------------------------------------------------------------------------------------  [employer], Ronal DELENA Bald Please contact us  directly via secure chat (search for [facility]) or by calling us  at [phone] [facility]). "

## 2024-05-20 NOTE — Progress Notes (Addendum)
 Patient seen and evaluated, chart reviewed, please see EMR for updated orders. Please see full H&P dictated by admitting physician Dr. Adefeso for same date of service.   Brief Summary:- 74 y.o. female with past medical history relevant for HTN, depression/anxiety disorder, HLD, overactive bladder, chronic pain syndrome,, hypothyroidism, and GERD admitted on 05/20/2024 for mechanical fall with comminuted, displaced, and impacted intertrochanteric fracture of the left proximal femur after mechanical fall at home --- Now being transferred to [facility] for further orthopedic evaluation and hip surgery   A/p 1)Lt Hip Fx--- orthopedic consult appreciated Plans for operative intervention on 05/21/2018 -N.p.o. after midnight -As needed IV and oral opiates as ordered -Further management per orthopedic team  2)HTN--stable, continue scheduled Toprol -XL - May use IV hydralazine  as needed elevated BP  3) chronic hypoxic respiratory failure--- patien is chronically on 2 L via nasal cannula at home - Oxygen  requirement is send at this time  4)Hypothyroidism--- continue Levothyroxine  50 mcg daily  5)HLD--- continue Pravastatin   6)Depression/Anxiety--- stable, continue scheduled Cymbalta  and as needed Xanax   7)GERD--continue Protonix  40 daily  8)Morbid Obesity- -Low calorie diet, portion control and increase physical activity discussed with patient after resolution of left hip issues -Body mass index is 43.28 kg/m.   Total care time 43 minutes including time spent coordinating care with orthopedic team  Rendall Carwin, MD

## 2024-05-20 NOTE — ED Provider Notes (Signed)
 "  [place] EMERGENCY DEPARTMENT AT [facility]  Provider Note  CSN: [id number] Arrival date & time: 05/20/24 9947  History Chief Complaint  Patient presents with   Megan Mcmahon    Megan Mcmahon is a 74 y.o. female brought to the ED via EMS from home after a mechanical fall a few hours ago, injuring her L hip. Denies head injury or LOC. Not on thinners. She wears oxygen  at all times. Given analgesia enroute.    Home Medications Prior to Admission medications  Medication Sig Start Date End Date Taking? Authorizing Provider  alendronate (FOSAMAX) 70 MG tablet Take 70 mg by mouth once a week. 08/11/21   [provider]  ALPRAZolam  (XANAX ) 0.5 MG tablet Take 0.5 mg by mouth 3 (three) times daily as needed for anxiety.    [provider]  amitriptyline (ELAVIL) 25 MG tablet Take 25 mg by mouth at bedtime. 10/08/22   [provider]  DULoxetine  (CYMBALTA ) 60 MG capsule Take 60 mg by mouth daily.    [provider]  famotidine  (PEPCID ) 20 MG tablet TAKE 1 TABLET DAILY AFTER SUPPER 03/11/24   Wert, Michael B, MD  fluticasone (FLONASE) 50 MCG/ACT nasal spray Place 1 spray into both nostrils daily. 10/15/22   [provider]  hydrochlorothiazide (HYDRODIURIL) 25 MG tablet Take 25 mg by mouth daily.    [provider]  levothyroxine  (SYNTHROID ) 50 MCG tablet Take 50 mcg by mouth daily before breakfast.    [provider]  metoprolol  succinate (TOPROL -XL) 50 MG 24 hr tablet Take 75 mg by mouth daily. Take with or immediately following a meal.    [provider]  pravastatin  (PRAVACHOL ) 20 MG tablet Take 20 mg by mouth daily.    [provider]  pregabalin (LYRICA) 75 MG capsule Take 75 mg by mouth 2 (two) times daily.    [provider]  tolterodine (DETROL LA) 4 MG 24 hr capsule Take 4 mg by mouth daily.    [provider]  traMADol  (ULTRAM ) 50 MG tablet Take by mouth. 1/2 tab 2-3X day 05/19/21    [provider]     Allergies    Codeine   Review of Systems   Review of Systems Please see HPI for pertinent positives and negatives  Physical Exam BP 101/67   Pulse 76   Temp (!) 97.5 F (36.4 C) (Oral)   Resp 16   Ht 4' 9 (1.448 m)   Wt 90.7 kg   SpO2 100%   BMI 43.28 kg/m   Physical Exam Vitals and nursing note reviewed.  Constitutional:      Appearance: Normal appearance.  HENT:     Head: Normocephalic and atraumatic.     Nose: Nose normal.     Mouth/Throat:     Mouth: Mucous membranes are moist.  Eyes:     Extraocular Movements: Extraocular movements intact.     Conjunctiva/sclera: Conjunctivae normal.  Cardiovascular:     Rate and Rhythm: Normal rate.     Pulses: Normal pulses.  Pulmonary:     Effort: Pulmonary effort is normal.     Breath sounds: Normal breath sounds.  Abdominal:     General: Abdomen is flat.     Palpations: Abdomen is soft.     Tenderness: There is no abdominal tenderness.  Musculoskeletal:        General: Tenderness and deformity (L leg is shortened and rotated) present.     Cervical back: Neck supple.  Skin:    General: Skin is warm and dry.  Neurological:     General: No focal deficit present.     Mental Status: She is alert.  Psychiatric:        Mood and Affect: Mood normal.     ED Results / Procedures / Treatments   EKG EKG Interpretation Date/Time:  Monday May 20 2024 01:05:05 EST Ventricular Rate:  74 PR Interval:  174 QRS Duration:  84 QT Interval:  395 QTC Calculation: 439 R Axis:   98  Text Interpretation: Sinus rhythm Anterior infarct, old No old tracing to compare Confirmed by Roselyn Dunnings [phone]) on 05/20/2024 1:20:55 AM  Procedures Procedures  Medications Ordered in the ED Medications  ondansetron  (ZOFRAN ) injection 4 mg (4 mg Intravenous Not Given 05/20/24 0229)  HYDROmorphone  (DILAUDID ) injection 1 mg (1 mg Intramuscular Given 05/20/24 0236)    Initial Impression and Plan   Patient here with suspected L hip fracture. Will check labs, xray. Pain/nausea meds for comfort.   ED Course   Clinical Course as of 05/20/24 0330  Mon May 20, 2024  0204 I personally viewed the images from radiology studies and agree with radiologist interpretation: CXR with chronically elevated R hemidiaphragm, otherwise clear. Hip shows a comminuted intertroch fracture. Will discuss with Ortho.  [CS]  0204 Patient with difficult IV access per RN, will attempt with US  guidance.  [CS]  0227 Even with US , I am unable to identify a vein amenable to cannulation. Labs are in process. Will give IM pain medication for comfort.  [CS]  0228 Spoke with Dr. Ernie, Ortho, who requests the patient be admitted at [facility] for operative repair likely Tuesday.  [CS]  0244 CBC with leukocytosis, likely from trauma. Does not report any infectious symptoms.  [CS]  0247 INR is normal.  [CS]  0255 BMP unremarkable, will discuss with Hospitalist.  [CS]  0329 Spoke with Blondie, NP with Hospitalist, he will evaluate the patient for admission.  [CS]    Clinical Course User Index [CS] Roselyn Dunnings NOVAK, MD     MDM Rules/Calculators/A&P Medical Decision Making Problems Addressed: Closed fracture dislocation of left hip joint, initial encounter [facility]): acute illness or injury Fall in home, initial encounter: acute illness or injury  Amount and/or Complexity of Data Reviewed Labs: ordered. Decision-making details documented in ED Course. Radiology: ordered and independent interpretation performed. Decision-making details documented in ED Course. ECG/medicine tests: ordered and independent interpretation performed. Decision-making details documented in ED Course.  Risk Prescription drug management. Parenteral controlled substances. Decision regarding hospitalization.     Final Clinical Impression(s) / ED Diagnoses Final diagnoses:  Fall in home, initial encounter  Closed fracture dislocation of left hip  joint, initial encounter [facility])    Rx / DC Orders ED Discharge Orders     None        Roselyn Dunnings NOVAK, MD 05/20/24 0330  "

## 2024-05-20 NOTE — Progress Notes (Signed)
 Patient ID: Megan Mcmahon, female   DOB: 10-15-1950, 74 y.o.   MRN: [id number]  Consult received for this 74 year old female with ground-level fall with a left comminuted proximal femur fracture  EXAM: 2 OR MORE VIEW(S) XRAY OF THE LEFT HIP 05/20/2024 01:50:00 AM   COMPARISON: None available.   CLINICAL HISTORY: hip fx, preop   FINDINGS:   BONES AND JOINTS: Acute markedly comminuted, displaced, and impacted intertrochanteric fracture of the left proximal femur. Medially displaced distal fracture fragment.   SOFT TISSUES: Unremarkable.   IMPRESSION: 1. Acute markedly comminuted, displaced, and impacted intertrochanteric fracture of the left proximal femur with medially displaced distal fracture fragment.   Electronically signed by: Morgane Naveau MD MD 05/20/2024 02:02 AM EST RP  Assessment: 1.  Closed comminuted reverse oblique intertrochanter versus peritrochanteric femur fracture   Consult received from [facility] emergency room.  Patient will be transferred to [facility] under the hospitalist service. Patient will require operative fixation of her left comminuted proximal femur fracture. Full consult note will follow with primary surgical team

## 2024-05-20 NOTE — ED Notes (Signed)
 Pts husband took home pts personal belongings and home medications

## 2024-05-20 NOTE — H&P (Addendum)
 "        PATIENT NAME: Megan Mcmahon    MR#:  [id number]  DATE OF BIRTH:  January 28, 1951  DATE OF ADMISSION:  05/20/2024  PRIMARY CARE PHYSICIAN: Trudy Vaughn FALCON, MD   Patient is coming from: [facility] ER   REQUESTING/REFERRING PHYSICIAN: Dr Roselyn  CHIEF COMPLAINT:   Chief Complaint  Patient presents with   Fall    HISTORY OF PRESENT ILLNESS:  Megan Mcmahon is a 74 y.o. female admitted for mechanical fall with comminuted, displaced, and impacted intertrochanteric fracture of the left proximal femur.  While going to take nighttime medicine, patient mis-stepped/became off balance and fell unobstructed.   She did not hit her head She was not dizzy and Did not lose consciousness.  She called for assistance from family member who called EMS.   ED Course: Imaging, Pain medication and EKG   EKG as reviewed : Sinus Rhythm   Imaging:  CXR - completed   Hip - Unilateral IMPRESSION: 1. Acute markedly comminuted, displaced, and impacted intertrochanteric fracture of the left proximal femur with medially displaced distal fracture fragment.   Electronically signed by: Morgane Naveau MD MD 05/20/2024 02:02 AM EST RP Workstation: [id number]  PAST MEDICAL HISTORY:   Past Medical History:  Diagnosis Date   Chronic kidney disease, stage 3 (HCC)    Fibromyalgia    GAD (generalized anxiety disorder)    GERD (gastroesophageal reflux disease)    Hyperlipidemia    Hypertension    Hypothyroidism     PAST SURGICAL HISTORY:   Past Surgical History:  Procedure Laterality Date   CATARACT EXTRACTION, BILATERAL     DILATION AND CURETTAGE OF UTERUS  2000    SOCIAL HISTORY:   Social History   Tobacco Use   Smoking status: Never   Smokeless tobacco: Never  Substance Use Topics   Alcohol  use: Never    FAMILY HISTORY:   Family History  Problem Relation Age of Onset   Stroke Mother        died age 79   Heart attack Father        died age 44    DRUG ALLERGIES:   Allergies[1]  REVIEW OF SYSTEMS:   Review of Systems  Constitutional: Negative.   Respiratory: Negative.    Musculoskeletal:  Positive for falls.  Neurological:  Negative for dizziness, tingling, sensory change and headaches.  Psychiatric/Behavioral: Negative.     As per history of present illness. All pertinent systems were reviewed above. Constitutional, HEENT, cardiovascular, respiratory, GI, GU, musculoskeletal, neuro, psychiatric, endocrine, integumentary and hematologic systems were reviewed and are otherwise negative/unremarkable except for positive findings mentioned above in the HPI.   MEDICATIONS AT HOME:   Prior to Admission medications  Medication Sig Start Date End Date Taking? Authorizing Provider  alendronate (FOSAMAX) 70 MG tablet Take 70 mg by mouth once a week. 08/11/21   [provider]  ALPRAZolam  (XANAX ) 0.5 MG tablet Take 0.5 mg by mouth 3 (three) times daily as needed for anxiety.    [provider]  amitriptyline (ELAVIL) 25 MG tablet Take 25 mg by mouth at bedtime. 10/08/22   [provider]  DULoxetine  (CYMBALTA ) 60 MG capsule Take 60 mg by mouth daily.    [provider]  famotidine  (PEPCID ) 20 MG tablet TAKE 1 TABLET DAILY AFTER SUPPER 03/11/24   Wert, Michael B, MD  fluticasone (FLONASE) 50 MCG/ACT nasal spray Place 1 spray into both nostrils daily. 10/15/22   [provider]  hydrochlorothiazide (HYDRODIURIL)  25 MG tablet Take 25 mg by mouth daily.    [provider]  levothyroxine  (SYNTHROID ) 50 MCG tablet Take 50 mcg by mouth daily before breakfast.    [provider]  metoprolol  succinate (TOPROL -XL) 50 MG 24 hr tablet Take 75 mg by mouth daily. Take with or immediately following a meal.    [provider]  pravastatin  (PRAVACHOL ) 20 MG tablet Take 20 mg by mouth daily.    [provider]  pregabalin (LYRICA) 75 MG capsule Take 75 mg by mouth 2 (two) times daily.    [provider]  tolterodine (DETROL LA) 4 MG 24 hr capsule Take 4 mg by mouth daily.    [provider]  traMADol  (ULTRAM ) 50 MG tablet Take by mouth. 1/2 tab 2-3X day 05/19/21   [provider]      VITAL SIGNS:  Blood pressure 101/67, pulse 76, temperature (!) 97.5 F (36.4 C), temperature source Oral, resp. rate 16, height 4' 9 (1.448 m), weight 90.7 kg, SpO2 100%.  PHYSICAL EXAMINATION:   GENERAL:  74 y.o.-year-old patient lying in the bed with no acute distress.  EYES: Pupils equal, round, reactive to light and accommodation.  HEENT: Head atraumatic, normocephalic.  NECK:  Supple, no jugular venous distention. No tenderness.  LUNGS: Normal breath sounds bilaterally, no wheezing, rales,rhonchi or crepitation. No use of accessory muscles of respiration. Patient is on Chronic home O2 at 4 LPM [place]  CARDIOVASCULAR: Regular rate and rhythm, S1, S2 normal. No murmurs, rubs, or gallops.  ABDOMEN: Soft, nondistended, nontender. Bowel sounds present. No organomegaly or mass.  EXTREMITIES: No pedal edema, or cyanosis, pulses are present bilaterally. NEUROLOGIC: Cranial nerves II through XII are intact. Muscle strength 5/5 in all extremities with the exception of the LEFT . Sensation is intact x 4 . Gait not checked due to fracture  PSYCHIATRIC: The patient is alert and oriented x 3.  Normal affect and good eye contact. SKIN: No obvious rash, lesion, or ulcer.   LABORATORY PANEL:   CBC Recent Labs  Lab 05/20/24 0215  WBC 20.1*  HGB 11.9*  HCT 37.9  PLT 197   ------------------------------------------------------------------------------------------------------------------  Chemistries  Recent Labs  Lab 05/20/24 0215  NA 135  K 3.8  CL 90*  CO2 33*  GLUCOSE 115*  BUN 10  CREATININE 0.73  CALCIUM 10.0   ------------------------------------------------------------------------------------------------------------------  Cardiac Enzymes No results for input(s):  TROPONINI in the last 168 hours. ------------------------------------------------------------------------------------------------------------------  RADIOLOGY:  DG Chest Port 1 View Result Date: 05/20/2024 EXAM: 1 VIEW(S) XRAY OF THE CHEST 05/20/2024 01:50:00 AM COMPARISON: 12/19/2023 CLINICAL HISTORY: hip fx, preop FINDINGS: LUNGS AND PLEURA: Elevated right hemidiaphragm. Subsegmental atelectasis or scarring at left lung base. No pleural effusion. No pneumothorax. HEART AND MEDIASTINUM: No acute abnormality of the cardiac and mediastinal silhouettes. BONES AND SOFT TISSUES: No acute osseous abnormality. IMPRESSION: 1. No acute cardiopulmonary abnormality. 2. Elevated right hemidiaphragm with subsegmental atelectasis at the left lung base. Electronically signed by: Morgane Naveau MD MD 05/20/2024 02:02 AM EST RP Workstation: [id number]   DG Hip Unilat With Pelvis 2-3 Views Left Result Date: 05/20/2024 EXAM: 2 OR MORE VIEW(S) XRAY OF THE LEFT HIP 05/20/2024 01:50:00 AM COMPARISON: None available. CLINICAL HISTORY: hip fx, preop FINDINGS: BONES AND JOINTS: Acute markedly comminuted, displaced, and impacted intertrochanteric fracture of the left proximal femur. Medially displaced distal fracture fragment. SOFT TISSUES: Unremarkable. IMPRESSION: 1. Acute markedly comminuted, displaced, and impacted intertrochanteric fracture of the left proximal femur with medially displaced distal  fracture fragment. Electronically signed by: Morgane Naveau MD MD 05/20/2024 02:02 AM EST RP Workstation: [id number]      IMPRESSION AND PLAN:   Active Problems Fracture          1.Acute comminuted, displaced, and impacted intertrochanteric fracture of the left proximal femur with medially displaced distal fracture fragment.          2. Consult completed to Dr Ernie in ER.         3. Transfer to [facility] for Repair - expected repair on Tues         4. Heart Healthy diet NPO on 05/21/24 at 0000 hrs for OR.  Continue IV  Dilaudid  0.4 mg every 4 hours as needed for moderate/severe pain  CKD3          1. Reported as Fluctuating by patient currently BUN Cr and GFR are within expected normal ranges          2. Avoid Nephrotoxic substances until otherwise noted.   GAD         1. Continue Home meds after reconciliation.  Hypothyroid           1. Continue Home medication after verification  DVT prophylaxis:  Code Status: Full Code Family Communication:  The plan of care was discussed in details with the patient (and family). I answered all questions. The patient agreed to proceed with the above mentioned plan. Further management will depend upon hospital course. Disposition Plan: Back to previous home environment Consults called: Dr Ernie - completed in ER - transfer to [facility] All the records are reviewed and case discussed with ED provider.   TOTAL TIME TAKING CARE OF THIS PATIENT: 25 minutes.    Lynwood MARLA Kipper ACNPC on 05/20/2024 at 3:30 AM  Lynwood Kipper BSN MSNA MSN ACNPC-AG Acute Care Nurse Practitioner [facility] [place]     CC: Primary care physician; Trudy Vaughn FALCON, MD      [1]  Allergies Allergen Reactions   Codeine     vomiting   "

## 2024-05-21 ENCOUNTER — Inpatient Hospital Stay (HOSPITAL_COMMUNITY)

## 2024-05-21 ENCOUNTER — Encounter (HOSPITAL_COMMUNITY): Payer: Self-pay | Admitting: Anesthesiology

## 2024-05-21 ENCOUNTER — Encounter (HOSPITAL_COMMUNITY)
Admission: EM | Disposition: A | Discharge status: Skilled Nursing Facility | Payer: Self-pay | Source: Home / Self Care | Attending: Family Medicine

## 2024-05-21 ENCOUNTER — Encounter (HOSPITAL_COMMUNITY): Payer: Self-pay | Admitting: Internal Medicine

## 2024-05-21 ENCOUNTER — Inpatient Hospital Stay (HOSPITAL_COMMUNITY): Payer: Self-pay | Admitting: Anesthesiology

## 2024-05-21 DIAGNOSIS — S72142A Displaced intertrochanteric fracture of left femur, initial encounter for closed fracture: Secondary | ICD-10-CM | POA: Diagnosis not present

## 2024-05-21 DIAGNOSIS — S72352A Displaced comminuted fracture of shaft of left femur, initial encounter for closed fracture: Secondary | ICD-10-CM

## 2024-05-21 DIAGNOSIS — I10 Essential (primary) hypertension: Secondary | ICD-10-CM

## 2024-05-21 HISTORY — PX: INTRAMEDULLARY (IM) NAIL INTERTROCHANTERIC: SHX5875

## 2024-05-21 LAB — CBC
HCT: 28.8 % — ABNORMAL LOW (ref 36.0–46.0)
Hemoglobin: 9.3 g/dL — ABNORMAL LOW (ref 12.0–15.0)
MCH: 28.7 pg (ref 26.0–34.0)
MCHC: 32.3 g/dL (ref 30.0–36.0)
MCV: 88.9 fL (ref 80.0–100.0)
Platelets: 150 K/uL (ref 150–400)
RBC: 3.24 MIL/uL — ABNORMAL LOW (ref 3.87–5.11)
RDW: 13.3 % (ref 11.5–15.5)
WBC: 9.9 K/uL (ref 4.0–10.5)
nRBC: 0 % (ref 0.0–0.2)

## 2024-05-21 LAB — COMPREHENSIVE METABOLIC PANEL WITH GFR
ALT: 10 U/L (ref 0–44)
AST: 23 U/L (ref 15–41)
Albumin: 3.6 g/dL (ref 3.5–5.0)
Alkaline Phosphatase: 51 U/L (ref 38–126)
Anion gap: 6 (ref 5–15)
BUN: 11 mg/dL (ref 8–23)
CO2: 37 mmol/L — ABNORMAL HIGH (ref 22–32)
Calcium: 9 mg/dL (ref 8.9–10.3)
Chloride: 89 mmol/L — ABNORMAL LOW (ref 98–111)
Creatinine, Ser: 0.73 mg/dL (ref 0.44–1.00)
GFR, Estimated: >60 mL/min
Glucose, Bld: 120 mg/dL — ABNORMAL HIGH (ref 70–99)
Potassium: 4.1 mmol/L (ref 3.5–5.1)
Sodium: 131 mmol/L — ABNORMAL LOW (ref 135–145)
Total Bilirubin: 0.5 mg/dL (ref 0.0–1.2)
Total Protein: 5.8 g/dL — ABNORMAL LOW (ref 6.5–8.1)

## 2024-05-21 LAB — SURGICAL PCR SCREEN
MRSA, PCR: NEGATIVE
Staphylococcus aureus: NEGATIVE

## 2024-05-21 MED ORDER — CEFAZOLIN SODIUM-DEXTROSE 2-4 GM/100ML-% IV SOLN
2.0000 g | Freq: Four times a day (QID) | INTRAVENOUS | Status: AC
  Administered 2024-05-21 (×2): 2 g via INTRAVENOUS
  Filled 2024-05-21 (×2): qty 100

## 2024-05-21 MED ORDER — CHLORHEXIDINE GLUCONATE 0.12 % MT SOLN
OROMUCOSAL | Status: AC
  Administered 2024-05-21: 15 mL via OROMUCOSAL
  Filled 2024-05-21: qty 15

## 2024-05-21 MED ORDER — OXYCODONE HCL 5 MG PO TABS: 5.0000 mg | ORAL_TABLET | Freq: Once | ORAL | Status: DC | PRN

## 2024-05-21 MED ORDER — CEFAZOLIN SODIUM-DEXTROSE 2-3 GM-%(50ML) IV SOLR
INTRAVENOUS | Status: DC | PRN
  Administered 2024-05-21: 2 g via INTRAVENOUS

## 2024-05-21 MED ORDER — CEFAZOLIN SODIUM 1 G IJ SOLR
INTRAMUSCULAR | Status: AC
  Filled 2024-05-21: qty 20

## 2024-05-21 MED ORDER — ROCURONIUM BROMIDE 10 MG/ML (PF) SYRINGE
PREFILLED_SYRINGE | INTRAVENOUS | Status: DC | PRN
  Administered 2024-05-21: 20 mg via INTRAVENOUS
  Administered 2024-05-21: 60 mg via INTRAVENOUS

## 2024-05-21 MED ORDER — VASOPRESSIN 20 UNIT/ML IV SOLN
INTRAVENOUS | Status: AC
  Filled 2024-05-21: qty 1

## 2024-05-21 MED ORDER — FENTANYL CITRATE (PF) 100 MCG/2ML IJ SOLN
INTRAMUSCULAR | Status: DC | PRN
  Administered 2024-05-21: 25 ug via INTRAVENOUS

## 2024-05-21 MED ORDER — LIDOCAINE 2% (20 MG/ML) 5 ML SYRINGE
INTRAMUSCULAR | Status: AC
  Filled 2024-05-21: qty 5

## 2024-05-21 MED ORDER — ONDANSETRON HCL 4 MG/2ML IJ SOLN
INTRAMUSCULAR | Status: AC
  Filled 2024-05-21: qty 2

## 2024-05-21 MED ORDER — DOCUSATE SODIUM 100 MG PO CAPS
100.0000 mg | ORAL_CAPSULE | Freq: Two times a day (BID) | ORAL | Status: DC
  Administered 2024-05-21 – 2024-05-26 (×7): 100 mg via ORAL
  Filled 2024-05-21 (×12): qty 1

## 2024-05-21 MED ORDER — ROCURONIUM BROMIDE 10 MG/ML (PF) SYRINGE
PREFILLED_SYRINGE | INTRAVENOUS | Status: AC
  Filled 2024-05-21: qty 10

## 2024-05-21 MED ORDER — LACTATED RINGERS IV SOLN: INTRAVENOUS | Status: DC

## 2024-05-21 MED ORDER — ONDANSETRON HCL 4 MG/2ML IJ SOLN: 4.0000 mg | Freq: Once | INTRAMUSCULAR | Status: DC | PRN

## 2024-05-21 MED ORDER — VASOPRESSIN 20 UNIT/ML IV SOLN
INTRAVENOUS | Status: DC | PRN
  Administered 2024-05-21: 2 [IU] via INTRAVENOUS
  Administered 2024-05-21 (×2): 1 [IU] via INTRAVENOUS
  Administered 2024-05-21: 2 [IU] via INTRAVENOUS

## 2024-05-21 MED ORDER — PROPOFOL 10 MG/ML IV BOLUS
INTRAVENOUS | Status: AC
  Filled 2024-05-21: qty 20

## 2024-05-21 MED ORDER — FENTANYL CITRATE (PF) 100 MCG/2ML IJ SOLN
INTRAMUSCULAR | Status: AC
  Filled 2024-05-21: qty 2

## 2024-05-21 MED ORDER — TRANEXAMIC ACID-NACL 1000-0.7 MG/100ML-% IV SOLN
1000.0000 mg | Freq: Once | INTRAVENOUS | Status: AC
  Administered 2024-05-21: 1000 mg via INTRAVENOUS
  Filled 2024-05-21: qty 100

## 2024-05-21 MED ORDER — MEPERIDINE HCL 25 MG/ML IJ SOLN: 6.2500 mg | INTRAMUSCULAR | Status: DC | PRN

## 2024-05-21 MED ORDER — PROPOFOL 10 MG/ML IV BOLUS
INTRAVENOUS | Status: DC | PRN
  Administered 2024-05-21: 80 mg via INTRAVENOUS

## 2024-05-21 MED ORDER — ONDANSETRON HCL 4 MG/2ML IJ SOLN
INTRAMUSCULAR | Status: DC | PRN
  Administered 2024-05-21: 4 mg via INTRAVENOUS

## 2024-05-21 MED ORDER — 0.9 % SODIUM CHLORIDE (POUR BTL) OPTIME
TOPICAL | Status: DC | PRN
  Administered 2024-05-21: 1000 mL

## 2024-05-21 MED ORDER — METOCLOPRAMIDE HCL 5 MG/ML IJ SOLN: 5.0000 mg | Freq: Three times a day (TID) | INTRAMUSCULAR | Status: DC | PRN

## 2024-05-21 MED ORDER — SUGAMMADEX SODIUM 200 MG/2ML IV SOLN
INTRAVENOUS | Status: DC | PRN
  Administered 2024-05-21: 200 mg via INTRAVENOUS

## 2024-05-21 MED ORDER — PHENYLEPHRINE 80 MCG/ML (10ML) SYRINGE FOR IV PUSH (FOR BLOOD PRESSURE SUPPORT)
PREFILLED_SYRINGE | INTRAVENOUS | Status: DC | PRN
  Administered 2024-05-21: 160 ug via INTRAVENOUS
  Administered 2024-05-21: 80 ug via INTRAVENOUS
  Administered 2024-05-21 (×3): 160 ug via INTRAVENOUS

## 2024-05-21 MED ORDER — ALBUMIN HUMAN 5 % IV SOLN: INTRAVENOUS | Status: DC | PRN

## 2024-05-21 MED ORDER — LIDOCAINE 2% (20 MG/ML) 5 ML SYRINGE
INTRAMUSCULAR | Status: DC | PRN
  Administered 2024-05-21: 60 mg via INTRAVENOUS

## 2024-05-21 MED ORDER — PHENOL 1.4 % MT LIQD: 1.0000 | OROMUCOSAL | Status: DC | PRN

## 2024-05-21 MED ORDER — CHLORHEXIDINE GLUCONATE 0.12 % MT SOLN: 15.0000 mL | Freq: Once | OROMUCOSAL | Status: AC

## 2024-05-21 MED ORDER — OXYCODONE HCL 5 MG/5ML PO SOLN: 5.0000 mg | Freq: Once | ORAL | Status: DC | PRN

## 2024-05-21 MED ORDER — FENTANYL CITRATE (PF) 100 MCG/2ML IJ SOLN
25.0000 ug | INTRAMUSCULAR | Status: DC | PRN
  Administered 2024-05-21: 25 ug via INTRAVENOUS

## 2024-05-21 MED ORDER — METOCLOPRAMIDE HCL 5 MG PO TABS: 5.0000 mg | ORAL_TABLET | Freq: Three times a day (TID) | ORAL | Status: DC | PRN

## 2024-05-21 MED ORDER — PHENYLEPHRINE HCL-NACL 20-0.9 MG/250ML-% IV SOLN
INTRAVENOUS | Status: DC | PRN
  Administered 2024-05-21: 40 ug/min via INTRAVENOUS

## 2024-05-21 MED ORDER — MENTHOL 3 MG MT LOZG: 1.0000 | LOZENGE | OROMUCOSAL | Status: DC | PRN

## 2024-05-21 MED ORDER — ORAL CARE MOUTH RINSE: 15.0000 mL | Freq: Once | OROMUCOSAL | Status: AC

## 2024-05-21 NOTE — Progress Notes (Signed)
 " PROGRESS NOTE    Megan Mcmahon  FMW:[id number] DOB: 03/11/1951 DOA: 05/20/2024 PCP: Trudy Vaughn FALCON, MD   Brief Narrative:  This 74 yrs old female presented in the ED status post mechanical fall with comminuted , displaced and impacted intertrochanteric fracture of left femur. Patient reports while going to take nighttime medicine,  Patient missed step and fell, did not hit her head, she was not dizzy and did not lose consciousness. Xray showed  Acute markedly comminuted, displaced, and impacted intertrochanteric fracture of the left proximal femur with medially displaced distal fracture fragment.  She was admitted for further evaluation orthopedics is consulted status post ORIF today POD #1.   Assessment & Plan:   Principal Problem:   Femur fracture (HCC) Active Problems:   Chronic respiratory failure with hypoxia and hypercapnia (HCC)   Morbid obesity due to excess calories (HCC)   Closed fracture dislocation of left hip joint (HCC)   Fall at home  Left hip fracture status post mechanical fall: Orthopedics consult appreciated. Patient is s/p ORIF today.  POD #1 As needed IV and oral opiates as ordered. Further management per orthopedic team.   Essential HTN--stable, continue scheduled Toprol -XL May use IV hydralazine  as needed elevated BP   Chronic hypoxic respiratory failure: Patient is chronically on 2 L via nasal cannula at home  Hypothyroidism: Continue Levothyroxine  50 mcg daily   HLD: Continue Pravastatin .   Depression/Anxiety :  stable, Continue scheduled Cymbalta  and as needed Xanax    GERD: Continue Protonix  40 daily   Morbid Obesity- -Low calorie diet, portion control and increase physical activity discussed with patient after resolution of left hip issues -Body mass index is 43.28 kg/m.  DVT prophylaxis: Lovenox  Code Status:Full code Family Communication: No family at bed side Disposition Plan:  Status is: Inpatient Remains inpatient appropriate  because: Admitted status post mechanical fall with left femur fracture,  orthopedics is consulted and scheduled for ORIF today.   Consultants:  Orthopaedics  Procedures: ORIF  Antimicrobials: Anti-infectives (From admission, onward)    None      Subjective: Patient was seen and examined at bedside.  Overnight events noted. Patient is scheduled to have ORIF today, reports pain is reasonably controlled.  Objective: Vitals:   05/21/24 0230 05/21/24 0640 05/21/24 0816 05/21/24 0902  BP: 124/83 98/69 (!) 101/59 101/73  Pulse: (!) 104 (!) 107  (!) 112  Resp: 18 18 17 18   Temp: 98.6 F (37 C) 98 F (36.7 C) 98.6 F (37 C) 98.7 F (37.1 C)  TempSrc: Oral  Oral Oral  SpO2: 100% 98% 95% 92%  Weight:    90.7 kg  Height:    4' 9 (1.448 m)    Intake/Output Summary (Last 24 hours) at 05/21/2024 1351 Last data filed at 05/21/2024 1315 Gross per 24 hour  Intake 1120 ml  Output 250 ml  Net 870 ml   Filed Weights   05/20/24 0100 05/21/24 0902  Weight: 90.7 kg 90.7 kg    Examination:  General exam: Appears calm and comfortable, not in any acute distress Respiratory system: Clear to auscultation. Respiratory effort normal.  RR 15 Cardiovascular system: S1 & S2 heard, RRR. No JVD, murmurs, rubs, gallops or clicks. No pedal edema. Gastrointestinal system: Abdomen is non distended, soft and non tender. Normal bowel sounds heard. Central nervous system: Alert and oriented x3. No focal neurological deficits. Extremities: Limited movements, left knee tenderness+ Skin: No rashes, lesions or ulcers Psychiatry: Judgement and insight appear normal. Mood & affect appropriate.  Data Reviewed: I have personally reviewed following labs and imaging studies  CBC: Recent Labs  Lab 05/20/24 0215 05/21/24 0622  WBC 20.1* 9.9  NEUTROABS 17.5*  --   HGB 11.9* 9.3*  HCT 37.9 28.8*  MCV 91.3 88.9  PLT 197 150   Basic Metabolic Panel: Recent Labs  Lab 05/20/24 0215 05/21/24 0622   NA 135 131*  K 3.8 4.1  CL 90* 89*  CO2 33* 37*  GLUCOSE 115* 120*  BUN 10 11  CREATININE 0.73 0.73  CALCIUM 10.0 9.0   GFR: Estimated Creatinine Clearance: 60.5 mL/min (by C-G formula based on SCr of 0.73 mg/dL). Liver Function Tests: Recent Labs  Lab 05/21/24 0622  AST 23  ALT 10  ALKPHOS 51  BILITOT 0.5  PROT 5.8*  ALBUMIN  3.6   No results for input(s): LIPASE, AMYLASE in the last 168 hours. No results for input(s): AMMONIA in the last 168 hours. Coagulation Profile: Recent Labs  Lab 05/20/24 0215  INR 0.9   Cardiac Enzymes: No results for input(s): CKTOTAL, CKMB, CKMBINDEX, TROPONINI in the last 168 hours. BNP (last 3 results) No results for input(s): PROBNP in the last 8760 hours. HbA1C: No results for input(s): HGBA1C in the last 72 hours. CBG: No results for input(s): GLUCAP in the last 168 hours. Lipid Profile: No results for input(s): CHOL, HDL, LDLCALC, TRIG, CHOLHDL, LDLDIRECT in the last 72 hours. Thyroid  Function Tests: No results for input(s): TSH, T4TOTAL, FREET4, T3FREE, THYROIDAB in the last 72 hours. Anemia Panel: No results for input(s): VITAMINB12, FOLATE, FERRITIN, TIBC, IRON, RETICCTPCT in the last 72 hours. Sepsis Labs: No results for input(s): PROCALCITON, LATICACIDVEN in the last 168 hours.  Recent Results (from the past 240 hours)  Surgical pcr screen     Status: None   Collection Time: 05/21/24  6:11 AM   Specimen: Nasal Mucosa; Nasal Swab  Result Value Ref Range Status   MRSA, PCR NEGATIVE NEGATIVE Final   Staphylococcus aureus NEGATIVE NEGATIVE Final    Comment: (NOTE) The Xpert SA Assay (FDA approved for NASAL specimens in patients 43 years of age and older), is one component of a comprehensive surveillance program. It is not intended to diagnose infection nor to guide or monitor treatment. Performed at [facility], [address] [place], [place] [address]           Radiology Studies: DG C-Arm 1-60 Min-No Report Result Date: 05/21/2024 Fluoroscopy was utilized by the requesting physician.  No radiographic interpretation.   DG C-Arm 1-60 Min-No Report Result Date: 05/21/2024 Fluoroscopy was utilized by the requesting physician.  No radiographic interpretation.   DG Chest Port 1 View Result Date: 05/20/2024 EXAM: 1 VIEW(S) XRAY OF THE CHEST 05/20/2024 01:50:00 AM COMPARISON: 12/19/2023 CLINICAL HISTORY: hip fx, preop FINDINGS: LUNGS AND PLEURA: Elevated right hemidiaphragm. Subsegmental atelectasis or scarring at left lung base. No pleural effusion. No pneumothorax. HEART AND MEDIASTINUM: No acute abnormality of the cardiac and mediastinal silhouettes. BONES AND SOFT TISSUES: No acute osseous abnormality. IMPRESSION: 1. No acute cardiopulmonary abnormality. 2. Elevated right hemidiaphragm with subsegmental atelectasis at the left lung base. Electronically signed by: Morgane Naveau MD MD 05/20/2024 02:02 AM EST RP Workstation: [id number]   DG Hip Unilat With Pelvis 2-3 Views Left Result Date: 05/20/2024 EXAM: 2 OR MORE VIEW(S) XRAY OF THE LEFT HIP 05/20/2024 01:50:00 AM COMPARISON: None available. CLINICAL HISTORY: hip fx, preop FINDINGS: BONES AND JOINTS: Acute markedly comminuted, displaced, and impacted intertrochanteric fracture of the left proximal femur. Medially  displaced distal fracture fragment. SOFT TISSUES: Unremarkable. IMPRESSION: 1. Acute markedly comminuted, displaced, and impacted intertrochanteric fracture of the left proximal femur with medially displaced distal fracture fragment. Electronically signed by: Morgane Naveau MD MD 05/20/2024 02:02 AM EST RP Workstation: [id number]    Scheduled Meds:  [MAR Hold] DULoxetine   60 mg Oral Daily   [MAR Hold] fesoterodine   4 mg Oral Daily   [MAR Hold] heparin  injection (subcutaneous)  5,000 Units Subcutaneous Q8H   [MAR Hold] heparin  injection (subcutaneous)  5,000 Units Subcutaneous Once    [MAR Hold] levothyroxine   50 mcg Oral Q0600   [MAR Hold] methocarbamol   500 mg Oral TID   [MAR Hold] metoprolol  succinate  75 mg Oral Daily   [MAR Hold] ondansetron  (ZOFRAN ) IV  4 mg Intravenous Once   [MAR Hold] pantoprazole   40 mg Oral Daily   [MAR Hold] pravastatin   20 mg Oral Daily   [MAR Hold] sodium chloride  flush  3 mL Intravenous Q12H   [MAR Hold] sodium chloride  flush  3 mL Intravenous Q12H   Continuous Infusions:  lactated ringers  10 mL/hr at 05/21/24 0914     LOS: 1 day    Time spent: 50 mins    Darcel Dawley, MD [facility]   If 7PM-7AM, please contact night-coverage  "

## 2024-05-21 NOTE — Transfer of Care (Signed)
 Immediate Anesthesia Transfer of Care Note  Patient: Megan Mcmahon  Procedure(s) Performed: FIXATION, FRACTURE, INTERTROCHANTERIC, WITH INTRAMEDULLARY ROD (Left)  Patient Location: PACU  Anesthesia Type:General  Level of Consciousness: awake, alert , oriented, and patient cooperative  Airway & Oxygen  Therapy: Patient Spontanous Breathing and Patient connected to face mask oxygen   Post-op Assessment: Report given to RN and Post -op Vital signs reviewed and stable  Post vital signs: Reviewed and stable  Last Vitals:  Vitals Value Taken Time  BP 105/57 05/21/24 14:00  Temp    Pulse 109 05/21/24 14:04  Resp 15 05/21/24 14:04  SpO2 94 % 05/21/24 14:04  Vitals shown include unfiled device data.  Last Pain:  Vitals:   05/21/24 0913  TempSrc:   PainSc: 7       Patients Stated Pain Goal: 3 (05/21/24 0917)  Complications: No notable events documented.

## 2024-05-21 NOTE — Plan of Care (Signed)
  Problem: Education: Goal: Knowledge of General Education information will improve Description: Including pain rating scale, medication(s)/side effects and non-pharmacologic comfort measures Outcome: Progressing   Problem: Clinical Measurements: Goal: Ability to maintain clinical measurements within normal limits will improve Outcome: Progressing   Problem: Nutrition: Goal: Adequate nutrition will be maintained Outcome: Progressing   Problem: Pain Managment: Goal: General experience of comfort will improve and/or be controlled Outcome: Progressing   Problem: Safety: Goal: Ability to remain free from injury will improve Outcome: Progressing   Problem: Skin Integrity: Goal: Risk for impaired skin integrity will decrease Outcome: Progressing

## 2024-05-21 NOTE — Plan of Care (Signed)
" °  Problem: Education: Goal: Knowledge of General Education information will improve Description: Including pain rating scale, medication(s)/side effects and non-pharmacologic comfort measures Outcome: Progressing   Problem: Nutrition: Goal: Adequate nutrition will be maintained Outcome: Progressing   Problem: Elimination: Goal: Will not experience complications related to urinary retention Outcome: Progressing   Problem: Education: Goal: Knowledge of the prescribed therapeutic regimen will improve Outcome: Progressing   Problem: Health Behavior/Discharge Planning: Goal: Ability to manage health-related needs will improve Outcome: Not Progressing   Problem: Clinical Measurements: Goal: Ability to maintain clinical measurements within normal limits will improve Outcome: Not Progressing   Problem: Activity: Goal: Risk for activity intolerance will decrease Outcome: Not Progressing   Problem: Pain Managment: Goal: General experience of comfort will improve and/or be controlled Outcome: Not Progressing   "

## 2024-05-21 NOTE — Progress Notes (Signed)
 Patient ID: Megan Mcmahon, female   DOB: June 30, 1950, 74 y.o.   MRN: [id number]  I saw the patient this morning after being transferred down from [facility] yesterday afternoon. I reviewed with the patient and her husband the condition of her left hip and the complexity of the fracture. 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  She requires 4 L of nasal cannula oxygen  all the time. She has bladder issues as well as bowel related concerns requiring a weekly management schedule.  Full note to be performed by surgical team. Currently n.p.o. I am uncertain on scheduling but the hopes is that her left hip will be addressed by our orthopedic trauma specialist today.  Postoperative plan briefly reviewed in terms of expectations and likelihood of need for skilled nursing facility based on her preoperative comorbidities.

## 2024-05-21 NOTE — Anesthesia Procedure Notes (Signed)
 Procedure Name: Intubation Date/Time: 05/21/2024 11:36 AM  Performed by: Marva Lonni PARAS, CRNAPre-anesthesia Checklist: Patient identified, Emergency Drugs available, Suction available and Patient being monitored Patient Re-evaluated:Patient Re-evaluated prior to induction Oxygen  Delivery Method: Circle System Utilized Preoxygenation: Pre-oxygenation with 100% oxygen  Induction Type: IV induction Ventilation: Mask ventilation without difficulty Laryngoscope Size: Glidescope and 3 Grade View: Grade I Tube type: Oral Tube size: 7.0 mm Number of attempts: 1 Airway Equipment and Method: Stylet Placement Confirmation: ETT inserted through vocal cords under direct vision, positive ETCO2 and breath sounds checked- equal and bilateral Secured at: 21 cm Tube secured with: Tape Dental Injury: Teeth and Oropharynx as per pre-operative assessment

## 2024-05-21 NOTE — Anesthesia Postprocedure Evaluation (Signed)
"   Anesthesia Post Note  Patient: THERMA LASURE  Procedure(s) Performed: FIXATION, FRACTURE, INTERTROCHANTERIC, WITH INTRAMEDULLARY ROD (Left)     Patient location during evaluation: PACU Anesthesia Type: General Level of consciousness: awake and alert Pain management: pain level controlled Vital Signs Assessment: post-procedure vital signs reviewed and stable Respiratory status: spontaneous breathing, nonlabored ventilation, respiratory function stable and patient connected to nasal cannula oxygen  Cardiovascular status: blood pressure returned to baseline and stable Postop Assessment: no apparent nausea or vomiting Anesthetic complications: no   No notable events documented.  Last Vitals:  Vitals:   05/21/24 1515 05/21/24 1552  BP: 98/68 (!) 122/94  Pulse: (!) 106 (!) 108  Resp: 16 15  Temp: 36.8 C 36.9 C  SpO2: 99% 92%    Last Pain:  Vitals:   05/21/24 1552  TempSrc: Oral  PainSc:                  Rea Reser      "

## 2024-05-21 NOTE — Op Note (Signed)
 05/21/2024  PATIENT:  Megan Mcmahon  1951-02-19 female   MEDICAL RECORD NUMBER: [id number]  PRE-OPERATIVE DIAGNOSIS:  SEVERELY COMMINUTED REVERSE OBLIQUITY FOUR PART INTERTROCHANTERIC HIP FRACTURE  POST-OPERATIVE DIAGNOSIS:  SEVERELY COMMINUTED REVERSE OBLIQUITY FOUR PART INTERTROCHANTERIC HIP FRACTURE  PROCEDURE:  INTRAMEDULLARY NAILING OF THE LEFT HIP using a statically locked [employer] Affixus nail 9 X 360 MM.  SURGEON:  Ozell DEL. Celena, M.D.  ASSISTANT:  PA Student.  ANESTHESIA:  General.  COMPLICATIONS:  None.  ESTIMATED BLOOD LOSS:  Less than 150 mL.  DISPOSITION:  To PACU.  CONDITION:  Stable.  DELAY START OF DVT PROPHYLAXIS BECAUSE OF BLEEDING RISK: NO  BRIEF SUMMARY AND INDICATION OF PROCEDURE:  Megan Mcmahon is a 74 y.o. year- old with multiple medical problems.  I discussed with the patient and husband risks and benefits of surgical treatment including the potential for malunion, nonunion, symptomatic hardware, heart attack, stroke, neurovascular injury, bleeding, and others.  After acknowledging these risks, consent was provided consent to proceed.  BRIEF SUMMARY OF PROCEDURE:  The patient was taken to the operating room where general anesthesia was induced.  Patient was positioned supine on the Hana fracture table.  A closed reduction maneuver was performed of the fractured proximal femur and this was confirmed on both AP and lateral xray views. A thorough scrub and wash with chlorhexidine  and then Betadine scrub and paint was performed.  After sterile drapes and time-out, a long instrument was used to identify the appropriate starting position under C-arm on both AP and lateral images.  A 3 cm incision was made proximal to the greater trochanter.  The curved cannulated awl was inserted just medial to the tip of the lateral trochanter and then the starting guidewire advanced into the proximal femur.  This was checked on AP and lateral views.  The starting reamer was  engaged with the soft tissue protected by a sleeve.  The curved ball-tipped guidewire was then inserted, making sure it was just posterior as possible in the distal femur and across the fracture site, which stayed in a reduced position.  It was sequentially reamed up to 11 mm and a 9 x 360 mm nail inserted to the appropriate depth.  The guidewire for the lag screw was then inserted with appropriate anteversion to make sure it was in a center-center position. Because of the potential for malrotation with lag screw insertion, an additional antirotation pin was placed proximal to this. The lag screw pin was measured and the lag screw placed with excellent purchase and position checked on both views.  The set screw was then engaged within the groove of the lag screw, which was allowed to telescope. The antirotation pin was withdrawn. Traction was released and compression achieved with the compression device over the lag screw. I then tightened the set screw and put in another anti-rotation screw cephalad to this given the reduction and apposition obtained. This was followed by placement of two distal locking screws using perfect circle technique.  This was confirmed on AP and lateral images. Wounds were irrigated thoroughly, closed in a standard layered fashion. Sterile gently compressive dressings were applied.  Francis Mt, PA-C, assisted throughout.  The patient was awakened from anesthesia and transported to the PACU in stable condition.  PROGNOSIS:  The patient will be weightbearing as tolerated with physical Therapy. DVT prophylaxis will be with Lovenox .  There are no range of motion precautions.  We will continue to follow while in the hospital. She will need a  step down progressive bed post op given her poor respiratory function and high potential for complications. Anticipate follow up in the office in 2 weeks for removal of sutures and new x-rays.     Ozell DEL. Celena, M.D.

## 2024-05-21 NOTE — Anesthesia Preprocedure Evaluation (Addendum)
"                                    Anesthesia Evaluation  Patient identified by MRN, date of birth, ID band Patient awake    Reviewed: Allergy & Precautions, H&P , NPO status , Patient's Chart, lab work & pertinent test results  Airway Mallampati: II  TM Distance: >3 FB Neck ROM: Full    Dental no notable dental hx.    Pulmonary shortness of breath and at rest Right Diaphragmatic  hemiparalysis s/p COVID   Pulmonary exam normal breath sounds clear to auscultation       Cardiovascular Exercise Tolerance: Good hypertension, + DOE  Normal cardiovascular exam Rhythm:Regular Rate:Normal  ECHO 2/22 1. Left ventricular ejection fraction, by estimation, is 65 to 70%. The  left ventricle has normal function. The left ventricle has no regional  wall motion abnormalities. Left ventricular diastolic parameters are  indeterminate.   2. Right ventricular systolic function is normal. The right ventricular  size is normal. Tricuspid regurgitation signal is inadequate for assessing  PA pressure.   3. The mitral valve is grossly normal, mildly thickened. Trivial mitral  valve regurgitation.   4. The aortic valve is tricuspid. Aortic valve regurgitation is not  visualized.   5. The inferior vena cava is normal in size with greater than 50%  respiratory variability, suggesting right atrial pressure of 3 mmHg.     Neuro/Psych  PSYCHIATRIC DISORDERS Anxiety      Neuromuscular disease negative neurological ROS  negative psych ROS   GI/Hepatic negative GI ROS, Neg liver ROS,GERD  ,,  Endo/Other  Hypothyroidism  Class 3 obesity  Renal/GU Renal diseasenegative Renal ROS  negative genitourinary   Musculoskeletal  (+)  Fibromyalgia -  Abdominal   Peds negative pediatric ROS (+)  Hematology negative hematology ROS (+)   Anesthesia Other Findings   Reproductive/Obstetrics negative OB ROS                              Anesthesia  Physical Anesthesia Plan  ASA: 4  Anesthesia Plan: General   Post-op Pain Management: Tylenol  PO (pre-op)*, Celebrex PO (pre-op)* and Minimal or no pain anticipated   Induction: Intravenous  PONV Risk Score and Plan: 3 and Ondansetron , Dexamethasone and Treatment may vary due to age or medical condition  Airway Management Planned: Oral ETT and LMA  Additional Equipment: None  Intra-op Plan:   Post-operative Plan: Extubation in OR  Informed Consent: I have reviewed the patients History and Physical, chart, labs and discussed the procedure including the risks, benefits and alternatives for the proposed anesthesia with the patient or authorized representative who has indicated his/her understanding and acceptance.       Plan Discussed with: Anesthesiologist and CRNA  Anesthesia Plan Comments: (  )         Anesthesia Quick Evaluation  "

## 2024-05-21 NOTE — Consult Note (Signed)
 "             Orthopaedic Trauma Service (OTS) Consultation   Patient ID: Megan Mcmahon MRN: [id number] DOB/AGE: Jul 27, 1950 74 y.o.   Reason for Consult: Left comminuted, complex left hip fracture Referring Physician: Adina Car, MD  HPI: Megan Mcmahon is an 74 y.o. female who sustained ground level fall. On 4L oxygen  for last several years. Had compression fractures thoracic and lumbar regions spontaneously which healed last spring. On Fosamax for 3-4 years. Was not using walker or cane except when had the spine fractures but has been thinking about using one. Ground level bedroom. Pain is  moderately well controlled now, aching and dull, sharp and severe with motion, without associated distal tingling or numbness, and improved with narcotics.    Past Medical History:  Diagnosis Date   Chronic kidney disease, stage 3 (HCC)    COVID-19 long hauler manifesting chronic dyspnea    Continous oxygen  therapy   Fibromyalgia    GAD (generalized anxiety disorder)    GERD (gastroesophageal reflux disease)    Hyperlipidemia    Hypertension    Hypothyroidism     Past Surgical History:  Procedure Laterality Date   CATARACT EXTRACTION, BILATERAL     DILATION AND CURETTAGE OF UTERUS  2000    Family History  Problem Relation Age of Onset   Stroke Mother        died age 81   Heart attack Father        died age 73    Social History:  reports that she has never smoked. She has never used smokeless tobacco. She reports that she does not drink alcohol  and does not use drugs.  Allergies: Allergies[1]  Medications: Prior to Admission:  Medications Prior to Admission  Medication Sig Dispense Refill Last Dose/Taking   alendronate (FOSAMAX) 70 MG tablet Take 70 mg by mouth once a week.   05/18/2024   ALPRAZolam  (XANAX ) 0.5 MG tablet Take 0.5 mg by mouth 3 (three) times daily as needed for anxiety.   05/19/2024 Evening   amitriptyline (ELAVIL) 25 MG tablet Take 25 mg by mouth at bedtime.  Take one quarter tablet at bedtime.   05/19/2024 Bedtime   DULoxetine  (CYMBALTA ) 60 MG capsule Take 60 mg by mouth daily.   05/19/2024 Morning   famotidine  (PEPCID ) 20 MG tablet TAKE 1 TABLET DAILY AFTER SUPPER 90 tablet 3 05/19/2024 Evening   fluticasone (FLONASE) 50 MCG/ACT nasal spray Place 1 spray into both nostrils daily.   Unknown   hydrochlorothiazide (HYDRODIURIL) 25 MG tablet Take 25 mg by mouth daily.   05/19/2024 Morning   levothyroxine  (SYNTHROID ) 50 MCG tablet Take 50 mcg by mouth daily before breakfast.   05/19/2024 Morning   metoprolol  succinate (TOPROL -XL) 50 MG 24 hr tablet Take 75 mg by mouth daily. Take with or immediately following a meal.   05/19/2024 Morning   pravastatin  (PRAVACHOL ) 20 MG tablet Take 20 mg by mouth daily.   05/19/2024 Evening   pregabalin (LYRICA) 75 MG capsule Take 75 mg by mouth 2 (two) times daily.   05/19/2024 Morning   tolterodine (DETROL LA) 4 MG 24 hr capsule Take 4 mg by mouth daily.   05/19/2024 Morning   traMADol  (ULTRAM ) 50 MG tablet Take 25-50 mg by mouth every 12 (twelve) hours as needed for moderate pain (pain score 4-6). 1/2 tab 2-3X day   05/19/2024 Evening    Results for orders placed or performed during the hospital encounter of 05/20/24 (from  the past 48 hours)  Basic metabolic panel     Status: Abnormal   Collection Time: 05/20/24  2:15 AM  Result Value Ref Range   Sodium 135 135 - 145 mmol/L   Potassium 3.8 3.5 - 5.1 mmol/L   Chloride 90 (L) 98 - 111 mmol/L   CO2 33 (H) 22 - 32 mmol/L   Glucose, Bld 115 (H) 70 - 99 mg/dL    Comment: Glucose reference range applies only to samples taken after fasting for at least 8 hours.   BUN 10 8 - 23 mg/dL   Creatinine, Ser 9.26 0.44 - 1.00 mg/dL   Calcium 89.9 8.9 - 89.6 mg/dL   GFR, Estimated >39 >39 mL/min    Comment: (NOTE) Calculated using the CKD-EPI Creatinine Equation (2021)    Anion gap 12 5 - 15    Comment: Performed at [facility], [address] [place], [place] [address]  CBC with  Differential     Status: Abnormal   Collection Time: 05/20/24  2:15 AM  Result Value Ref Range   WBC 20.1 (H) 4.0 - 10.5 K/uL   RBC 4.15 3.87 - 5.11 MIL/uL   Hemoglobin 11.9 (L) 12.0 - 15.0 g/dL   HCT 62.0 63.9 - 53.9 %   MCV 91.3 80.0 - 100.0 fL   MCH 28.7 26.0 - 34.0 pg   MCHC 31.4 30.0 - 36.0 g/dL   RDW 86.6 88.4 - 84.4 %   Platelets 197 150 - 400 K/uL   nRBC 0.0 0.0 - 0.2 %   Neutrophils Relative % 87 %   Neutro Abs 17.5 (H) 1.7 - 7.7 K/uL   Lymphocytes Relative 6 %   Lymphs Abs 1.1 0.7 - 4.0 K/uL   Monocytes Relative 6 %   Monocytes Absolute 1.3 (H) 0.1 - 1.0 K/uL   Eosinophils Relative 0 %   Eosinophils Absolute 0.1 0.0 - 0.5 K/uL   Basophils Relative 0 %   Basophils Absolute 0.0 0.0 - 0.1 K/uL   Immature Granulocytes 1 %   Abs Immature Granulocytes 0.11 (H) 0.00 - 0.07 K/uL    Comment: Performed at [facility], [address] [place], [place] [address]  Protime-INR     Status: None   Collection Time: 05/20/24  2:15 AM  Result Value Ref Range   Prothrombin Time 12.5 11.4 - 15.2 seconds   INR 0.9 0.8 - 1.2    Comment: (NOTE) INR goal varies based on device and disease states. Performed at [facility], [address] [place], [place] [address]   Type and screen [facility]     Status: None   Collection Time: 05/20/24  2:15 AM  Result Value Ref Range   ABO/RH(D) O POS    Antibody Screen NEG    Sample Expiration      05/23/2024,2359 Performed at [facility], [address], [place], [place] [address]   Type and screen [facility]     Status: None   Collection Time: 05/20/24 10:09 PM  Result Value Ref Range   ABO/RH(D) O POS    Antibody Screen NEG    Sample Expiration      05/23/2024,2359 Performed at [facility], [address] [place], [place] [address]   Surgical pcr screen     Status: None   Collection Time: 05/21/24  6:11 AM   Specimen: Nasal Mucosa; Nasal Swab  Result Value Ref Range   MRSA, PCR NEGATIVE NEGATIVE    Staphylococcus aureus  NEGATIVE NEGATIVE    Comment: (NOTE) The Xpert SA Assay (FDA approved for NASAL specimens in patients 65 years of age and older), is one component of a comprehensive surveillance program. It is not intended to diagnose infection nor to guide or monitor treatment. Performed at [facility], [address] [place], [place] [address]   Comprehensive metabolic panel     Status: Abnormal   Collection Time: 05/21/24  6:22 AM  Result Value Ref Range   Sodium 131 (L) 135 - 145 mmol/L   Potassium 4.1 3.5 - 5.1 mmol/L   Chloride 89 (L) 98 - 111 mmol/L   CO2 37 (H) 22 - 32 mmol/L   Glucose, Bld 120 (H) 70 - 99 mg/dL    Comment: Glucose reference range applies only to samples taken after fasting for at least 8 hours.   BUN 11 8 - 23 mg/dL   Creatinine, Ser 9.26 0.44 - 1.00 mg/dL   Calcium 9.0 8.9 - 89.6 mg/dL   Total Protein 5.8 (L) 6.5 - 8.1 g/dL   Albumin  3.6 3.5 - 5.0 g/dL   AST 23 15 - 41 U/L   ALT 10 0 - 44 U/L   Alkaline Phosphatase 51 38 - 126 U/L   Total Bilirubin 0.5 0.0 - 1.2 mg/dL   GFR, Estimated >39 >39 mL/min    Comment: (NOTE) Calculated using the CKD-EPI Creatinine Equation (2021)    Anion gap 6 5 - 15    Comment: Performed at [facility], [address] [place], [place] [address]  CBC     Status: Abnormal   Collection Time: 05/21/24  6:22 AM  Result Value Ref Range   WBC 9.9 4.0 - 10.5 K/uL   RBC 3.24 (L) 3.87 - 5.11 MIL/uL   Hemoglobin 9.3 (L) 12.0 - 15.0 g/dL   HCT 71.1 (L) 63.9 - 53.9 %   MCV 88.9 80.0 - 100.0 fL   MCH 28.7 26.0 - 34.0 pg   MCHC 32.3 30.0 - 36.0 g/dL   RDW 86.6 88.4 - 84.4 %   Platelets 150 150 - 400 K/uL   nRBC 0.0 0.0 - 0.2 %    Comment: Performed at [facility], [address], [place], [place] [address]    DG Chest Port 1 View Result Date: 05/20/2024 EXAM: 1 VIEW(S) XRAY OF THE CHEST 05/20/2024 01:50:00 AM COMPARISON: 12/19/2023 CLINICAL HISTORY: hip fx, preop FINDINGS: LUNGS AND PLEURA:  Elevated right hemidiaphragm. Subsegmental atelectasis or scarring at left lung base. No pleural effusion. No pneumothorax. HEART AND MEDIASTINUM: No acute abnormality of the cardiac and mediastinal silhouettes. BONES AND SOFT TISSUES: No acute osseous abnormality. IMPRESSION: 1. No acute cardiopulmonary abnormality. 2. Elevated right hemidiaphragm with subsegmental atelectasis at the left lung base. Electronically signed by: Morgane Naveau MD MD 05/20/2024 02:02 AM EST RP Workstation: [id number]   DG Hip Unilat With Pelvis 2-3 Views Left Result Date: 05/20/2024 EXAM: 2 OR MORE VIEW(S) XRAY OF THE LEFT HIP 05/20/2024 01:50:00 AM COMPARISON: None available. CLINICAL HISTORY: hip fx, preop FINDINGS: BONES AND JOINTS: Acute markedly comminuted, displaced, and impacted intertrochanteric fracture of the left proximal femur. Medially displaced distal fracture fragment. SOFT TISSUES: Unremarkable. IMPRESSION: 1. Acute markedly comminuted, displaced, and impacted intertrochanteric fracture of the left proximal femur with medially displaced distal fracture fragment. Electronically signed by: Morgane Naveau MD MD 05/20/2024 02:02 AM EST RP Workstation: [id number]    Intake/Output      01/12 0701 01/13 0700 01/13 0701 01/14  0700   P.O. 120    Total Intake(mL/kg) 120 (1.3)    Urine (mL/kg/hr) 250 (0.1)    Total Output 250    Net -130            ROS multiple as above; No recent fever, bleeding abnormalities, urologic dysfunction, or weight gain. Does take Dulcolax twice weekly, Mon and Th.  Blood pressure 101/73, pulse (!) 112, temperature 98.7 F (37.1 C), temperature source Oral, resp. rate 18, height 4' 9 (1.448 m), weight 90.7 kg, SpO2 92%. Physical Exam NCAT RRR Abd soft LLE Buck's traction  Edema/ swelling controlled  Sens: DPN, SPN, TN intact  Motor: EHL, FHL, and lessor toe ext and flex all intact grossly  DP 2+, PT 2+, Brisk cap refill, warm to touch       Gait: could not  assess Coordination and balance: could not assess   Assessment/Plan:  Severely comminuted left proximal femur fracture Severe osteoporosis Multiple medical problems, including O2 dependence, poor GI motility, hemidiaphragm paralysis  Weightbearing: WBAT LLE Insicional and dressing care: Reinforce dressings as needed Orthopedic device(s): walker Showering: yes, please VTE prophylaxis: Lovenox  40mg  qd 4 wks Pain control: Norco Follow - up plan: 2 weeks Contact information:  Megan Bruch, MD, Francis Mt, PA-C   Megan Bruch, MD [facility], [facility] [phone]  05/21/2024, 10:39 AM  [facility] [address] [place] [address] [phone] [phone] (F)    After 5pm and on the weekends please log on to Amion, go to orthopaedics and the look under the [facility] Call for the provider(s) on call. You can also call our office at [phone] and then follow the prompts to be connected to the call team.      [1]  Allergies Allergen Reactions   Other Rash    Catgut sutures   Wound Dressing Adhesive Rash   Codeine     vomiting   "

## 2024-05-22 ENCOUNTER — Encounter (HOSPITAL_COMMUNITY): Payer: Self-pay | Admitting: Orthopedic Surgery

## 2024-05-22 DIAGNOSIS — S72352A Displaced comminuted fracture of shaft of left femur, initial encounter for closed fracture: Secondary | ICD-10-CM | POA: Diagnosis not present

## 2024-05-22 LAB — BASIC METABOLIC PANEL WITH GFR
Anion gap: 7 (ref 5–15)
BUN: 8 mg/dL (ref 8–23)
CO2: 34 mmol/L — ABNORMAL HIGH (ref 22–32)
Calcium: 8.8 mg/dL — ABNORMAL LOW (ref 8.9–10.3)
Chloride: 93 mmol/L — ABNORMAL LOW (ref 98–111)
Creatinine, Ser: 0.75 mg/dL (ref 0.44–1.00)
GFR, Estimated: >60 mL/min
Glucose, Bld: 131 mg/dL — ABNORMAL HIGH (ref 70–99)
Potassium: 4 mmol/L (ref 3.5–5.1)
Sodium: 134 mmol/L — ABNORMAL LOW (ref 135–145)

## 2024-05-22 LAB — CBC
HCT: 24.5 % — ABNORMAL LOW (ref 36.0–46.0)
Hemoglobin: 8 g/dL — ABNORMAL LOW (ref 12.0–15.0)
MCH: 29 pg (ref 26.0–34.0)
MCHC: 32.7 g/dL (ref 30.0–36.0)
MCV: 88.8 fL (ref 80.0–100.0)
Platelets: 148 K/uL — ABNORMAL LOW (ref 150–400)
RBC: 2.76 MIL/uL — ABNORMAL LOW (ref 3.87–5.11)
RDW: 13.5 % (ref 11.5–15.5)
WBC: 12.1 K/uL — ABNORMAL HIGH (ref 4.0–10.5)
nRBC: 0 % (ref 0.0–0.2)

## 2024-05-22 LAB — VITAMIN D 25 HYDROXY (VIT D DEFICIENCY, FRACTURES): Vit D, 25-Hydroxy: 37.3 ng/mL (ref 30–100)

## 2024-05-22 MED ORDER — VITAMIN D 25 MCG (1000 UNIT) PO TABS
2000.0000 [IU] | ORAL_TABLET | Freq: Two times a day (BID) | ORAL | Status: DC
  Administered 2024-05-22 – 2024-05-27 (×10): 2000 [IU] via ORAL
  Filled 2024-05-22 (×10): qty 2

## 2024-05-22 MED ORDER — ENSURE PLUS HIGH PROTEIN PO LIQD
237.0000 mL | Freq: Two times a day (BID) | ORAL | Status: DC
  Administered 2024-05-23 – 2024-05-24 (×3): 237 mL via ORAL

## 2024-05-22 NOTE — Progress Notes (Signed)
 "                                                                         Orthopaedic Trauma Service Progress Note  Patient ID: [name] MRN: [id number] DOB/AGE: 72-Jul-1952 72 y.o.  Subjective:  + L hip pain but tolerable About to work with therapy  No assistive devices at baseline  Has been on fosamax about 2 years.  Last dexa was 2 years ago, unable to see it on cone system   Vitamin d  levels look ok   ROS As above  Today's  total administered Morphine Milligram Equivalents: 27.5 Yesterday's total administered Morphine Milligram Equivalents: 62.5  Objective:   VITALS:   Vitals:   05/21/24 1934 05/21/24 2306 05/22/24 0259 05/22/24 0855  BP: 100/62 (!) 103/58 (!) 113/56 (!) 118/57  Pulse: (!) 106 (!) 111 (!) 118 (!) 109  Resp: 15 15 18 19   Temp: 97.8 F (36.6 C) 98.1 F (36.7 C) 99.2 F (37.3 C) 98.7 F (37.1 C)  TempSrc: Oral Oral Oral Axillary  SpO2: 97% 96% 98% 96%  Weight:      Height:        Estimated body mass index is 43.28 kg/m as calculated from the following:   Height as of this encounter: 4' 9 (1.448 m).   Weight as of this encounter: 90.7 kg.   Intake/Output      01/13 0701 01/14 0700 01/14 0701 01/15 0700   P.O.     I.V. (mL/kg) 1500 (16.5)    IV Piggyback 250    Total Intake(mL/kg) 1750 (19.3)    Urine (mL/kg/hr) 1650 (0.8)    Blood 25    Total Output 1675    Net +75         Urine Occurrence 1 x      LABS  Results for orders placed or performed during the hospital encounter of 05/20/24 (from the past 24 hours)  CBC     Status: Abnormal   Collection Time: 05/22/24  5:45 AM  Result Value Ref Range   WBC 12.1 (H) 4.0 - 10.5 K/uL   RBC 2.76 (L) 3.87 - 5.11 MIL/uL   Hemoglobin 8.0 (L) 12.0 - 15.0 g/dL   HCT 75.4 (L) 63.9 - 53.9 %   MCV 88.8 80.0 - 100.0 fL   MCH 29.0 26.0 - 34.0 pg   MCHC 32.7 30.0 - 36.0 g/dL   RDW 86.4 88.4 - 84.4 %   Platelets 148 (L) 150 - 400 K/uL   nRBC 0.0 0.0 - 0.2 %  Basic metabolic panel      Status: Abnormal   Collection Time: 05/22/24  5:45 AM  Result Value Ref Range   Sodium 134 (L) 135 - 145 mmol/L   Potassium 4.0 3.5 - 5.1 mmol/L   Chloride 93 (L) 98 - 111 mmol/L   CO2 34 (H) 22 - 32 mmol/L   Glucose, Bld 131 (H) 70 - 99 mg/dL   BUN 8 8 - 23 mg/dL   Creatinine, Ser 9.24 0.44 - 1.00 mg/dL   Calcium 8.8 (L) 8.9 - 10.3 mg/dL   GFR, Estimated >39 >39 mL/min   Anion gap 7 5 - 15  VITAMIN D  25 Hydroxy (  Vit-D Deficiency, Fractures)     Status: None   Collection Time: 05/22/24  5:45 AM  Result Value Ref Range   Vit D, 25-Hydroxy 37.3 30 - 100 ng/mL     PHYSICAL EXAM:   Gen: sitting up in bed, NAD Ext:       Left Lower Extremity Dressings are clean, dry and intact with scant strikethrough to L thigh   Extremity is warm  No DCT  Compartments are soft  No pain out of proportion with passive stretching of toes or ankle  DPN, SPN, TN sensory functions are intact  EHL, FHL, lesser toe motor functions intact  Ankle flexion, extension, inversion eversion intact  + DP pulse  Assessment/Plan: 1 Day Post-Op   Principal Problem:   Femur fracture (HCC) Active Problems:   Chronic respiratory failure with hypoxia and hypercapnia (HCC)   Morbid obesity due to excess calories (HCC)   Closed fracture dislocation of left hip joint (HCC)   Fall at home   Anti-infectives (From admission, onward)    Start     Dose/Rate Route Frequency Ordered Stop   05/21/24 1800  ceFAZolin  (ANCEF ) IVPB 2g/100 mL premix        2 g 200 mL/hr over 30 Minutes Intravenous Every 6 hours 05/21/24 1516 05/21/24 2340     .  POD/HD#: 1  74 y/o Mcmahon s/p fall with L subtrochanteric femur fracture    - ground level fall   - L subtrochanteric femur fracture s/p IMN   WBAT L leg with assistance  Unrestricted ROM L hip and knee   Therapy evals  Will likely need SNF  Ice PRN  Dressing changes as needed starting tomorrow   - Pain management:  Multimodal  Minimize narcotics due to pulm  issues  - ABL anemia/Hemodynamics  Stable, monitor  - Medical issues   Per Primary   - DVT/PE prophylaxis:  On SQ heparin   Recommend DOAC at dc x 30 days  - ID:   Periop abx  - Metabolic Bone Disease:  Known osteoporosis  Fragility fracture  Vitamin d  levels look good  Will discuss different pharmacologic treatment at follow up. Will likely refer to osteoporosis clinic  - Activity:  As above  - Impediments to fracture healing:  Pulm disease  Thyroid  disease   Osteoporosis   Fragility fracture  - Dispo:  Ortho issues stable  TOC consult   Therapies     Francis MICAEL Mt, PA-C [phone] (C) 05/22/2024, 9:19 AM  [facility] [address] [place] [address] [phone] [phone] (F)    After 5pm and on the weekends please log on to Amion, go to orthopaedics and the look under the [facility] Call for the provider(s) on call. You can also call our office at [phone] and then follow the prompts to be connected to the call team.  Patient ID: Megan Mcmahon   DOB: [birth year], 74 y.o.   MRN: [id number]  "

## 2024-05-22 NOTE — Evaluation (Signed)
 Physical Therapy Evaluation Patient Details Name: Megan Mcmahon MRN: [id number] DOB: 01/01/1951 Today's Date: 05/22/2024  History of Present Illness  Pt is a 74 y/o female presenting on 05/20/24 after fall.  Found with intertrochanteric fx of L femur. 1/13 S/P IM nail L hip, post op WBAT. PMH includes: CKD3, fibromyalgia, CAD, HTN, cataracts, baseline 4L O2 from COVID.  Clinical Impression  Pt admitted with above. PTA pt lived with spouse and was indep without AD. Pt now presenting with lethargy, delayed processing, impaired balance, and L LE pain limiting active/passive ROM tolerance in addition to WBing tolerance requiring maxAx2 for mobility. Pt to benefit from inpatient rehab program < 3 hrs/day to address above deficits and achieve safe level of function for transition home with spouse. Acute PT to cont to follow.         If plan is discharge home, recommend the following: A lot of help with walking and/or transfers;A lot of help with bathing/dressing/bathroom;Help with stairs or ramp for entrance;Supervision due to cognitive status   Can travel by private vehicle   No    Equipment Recommendations  (TBD at next venue)  Recommendations for Other Services       Functional Status Assessment Patient has had a recent decline in their functional status and/or demonstrates limited ability to make significant improvements in function in a reasonable and predictable amount of time     Precautions / Restrictions Precautions Precautions: Fall Restrictions Weight Bearing Restrictions Per Provider Order: Yes LLE Weight Bearing Per Provider Order: Weight bearing as tolerated      Mobility  Bed Mobility Overal bed mobility: Needs Assistance Bed Mobility: Supine to Sit     Supine to sit: Total assist, +2 for physical assistance     General bed mobility comments: HOB elevated, totalA for LE management off EOB and trunk elevation, pt very delayed with processing and sequencing  requiring max verbal and tactile cues, pt easily falls asleep    Transfers Overall transfer level: Needs assistance Equipment used: 2 person hand held assist Transfers: Sit to/from Stand, Bed to chair/wheelchair/BSC Sit to Stand: Max assist, +2 physical assistance   Step pivot transfers: Max assist, +2 physical assistance       General transfer comment: pt with good power up but limited L LE WBing due to pain during transfer requiring max tactile cues to advance L LE, blocking of L knee to support to minimize buckling    Ambulation/Gait               General Gait Details: limited by L LE pain to transfer to chair  Stairs            Wheelchair Mobility     Tilt Bed    Modified Rankin (Stroke Patients Only)       Balance Overall balance assessment: Needs assistance Sitting-balance support: No upper extremity supported, Feet supported Sitting balance-Leahy Scale: Fair Sitting balance - Comments: min assist fading to close supervision statically   Standing balance support: Bilateral upper extremity supported, During functional activity Standing balance-Leahy Scale: Poor Standing balance comment: relies on BUE and external support                             Pertinent Vitals/Pain Pain Assessment Pain Assessment: Faces Faces Pain Scale: Hurts even more Pain Location: L hip with standing Pain Descriptors / Indicators: Discomfort, Grimacing, Operative site guarding Pain Intervention(s): Limited activity within patient's tolerance  Home Living Family/patient expects to be discharged to:: Private residence Living Arrangements: Spouse/significant other Available Help at Discharge: Family;Available 24 hours/day Type of Home: House Home Access: Stairs to enter   [employer] of Steps: 1   Home Layout: One level Home Equipment: [employer] (2 wheels) Additional Comments: on 4L O2 baseline    Prior Function Prior Level of Function  : Independent/Modified Independent             Mobility Comments: no AD ADLs Comments: ind ADLs, IADLs. does need assist for managing her hair     Extremity/Trunk Assessment   Upper Extremity Assessment Upper Extremity Assessment: Generalized weakness (spouse reports having difficulty with overhead function for several years)    Lower Extremity Assessment Lower Extremity Assessment: RLE deficits/detail;LLE deficits/detail RLE Deficits / Details: generalized weakness LLE Deficits / Details: active movement limited by pain, < 50% WBing tolerance       Communication   Communication Communication: Impaired Factors Affecting Communication: Difficulty expressing self    Cognition Arousal: Alert Behavior During Therapy: Flat affect                             Following commands: Impaired Following commands impaired: Follows one step commands with increased time     Cueing Cueing Techniques: Verbal cues     General Comments General comments (skin integrity, edema, etc.): VSS, L LE with edema as expected    Exercises     Assessment/Plan    PT Assessment    PT Problem List         PT Treatment Interventions      PT Goals (Current goals can be found in the Care Plan section)  Acute Rehab PT Goals Patient Stated Goal: get better PT Goal Formulation: With patient/family Time For Goal Achievement: 06/05/24 Potential to Achieve Goals: Good    Frequency       Co-evaluation PT/OT/SLP Co-Evaluation/Treatment: Yes Reason for Co-Treatment: For patient/therapist safety;To address functional/ADL transfers PT goals addressed during session: Mobility/safety with mobility         AM-PAC PT 6 Clicks Mobility  Outcome Measure Help needed turning from your back to your side while in a flat bed without using bedrails?: Total Help needed moving from lying on your back to sitting on the side of a flat bed without using bedrails?: Total Help needed moving  to and from a bed to a chair (including a wheelchair)?: A Lot Help needed standing up from a chair using your arms (e.g., wheelchair or bedside chair)?: A Lot Help needed to walk in hospital room?: Total Help needed climbing 3-5 steps with a railing? : Total 6 Click Score: 8    End of Session Equipment Utilized During Treatment: Gait belt;Oxygen  (4Lo2 via [place]) Activity Tolerance: Patient limited by lethargy;Patient limited by fatigue Patient left: in chair;with call bell/phone within reach;with chair alarm set;with family/visitor present Nurse Communication: Mobility status;Need for lift equipment (can use stedy) PT Visit Diagnosis: Unsteadiness on feet (R26.81);Muscle weakness (generalized) (M62.81);Pain Pain - Right/Left: Left Pain - part of body: Leg    Time: 9054-8981 PT Time Calculation (min) (ACUTE ONLY): 33 min   Charges:   PT Evaluation $PT Eval Moderate Complexity: 1 Mod   PT General Charges $$ ACUTE PT VISIT: 1 Visit         Norene Ames, PT, DPT Acute Rehabilitation Services Secure chat preferred Office #: [phone]   Norene CHRISTELLA Ames 05/22/2024, 1:54 PM

## 2024-05-22 NOTE — Discharge Instructions (Addendum)
 "  Nutrition Post Hospital Stay Proper nutrition can help your body recover from illness and injury.   Foods and beverages high in protein, vitamins, and minerals help rebuild muscle loss, promote healing, & reduce fall risk.   In addition to eating healthy foods, a nutrition shake is an easy, delicious way to get the nutrition you need during and after your hospital stay  It is recommended that you continue to drink 2 bottles per day of:       Ensure for at least 1 month (30 days) after your hospital stay   Tips for adding a nutrition shake into your routine: As allowed, drink one with vitamins or medications instead of water or juice Enjoy one as a tasty mid-morning or afternoon snack Drink cold or make a milkshake out of it Drink one instead of milk with cereal or snacks Use as a coffee creamer     Available at the following grocery stores and pharmacies:           * [place] [facility] * [employer] * [employer]  * [facility]          * [employer] * [employer]  * [facility]      * Target  * [employer]'s   * [facility]  * [employer]   * [facility] [phone]            For COUPONS visit: [email or web address]/join or [email or web address]   Suggested Substitutions Ensure Plus = Boost Plus = Carnation Breakfast Essentials = Boost Compact Ensure Active Clear = Boost Breeze Glucerna Shake = Boost Glucose Control = Carnation Breakfast Essentials SUGAR FREE      Orthopaedic Trauma Service Discharge Instructions   General Discharge Instructions  Orthopaedic Injuries:  Left hip fracture treated with intramedullary nailing  WEIGHT BEARING STATUS: Weight-bear as tolerated left leg with assistance  RANGE OF MOTION/ACTIVITY: Unrestricted range of motion of left hip and knee  Bone health: Recommend 5000 IUs of vitamin D3 daily  Review the following resource for additional information regarding bone health  [email or web address].cy  Wound Care: Daily wound care as  needed upon discharge from the hospital.  See below   Discharge Wound Care Instructions  Do NOT apply any ointments, solutions or lotions to pin sites or surgical wounds.  These prevent needed drainage and even though solutions like hydrogen peroxide kill bacteria, they also damage cells lining the pin sites that help fight infection.  Applying lotions or ointments can keep the wounds moist and can cause them to breakdown and open up as well. This can increase the risk for infection. When in doubt call the office.  Surgical incisions should be dressed daily.  If any drainage is noted, use one layer of adaptic or Mepitel, then gauze, and tape.  Alternatively you can use a silicone foam dressing such as Mepilex  Netcamper[employer]cz [email or web address]/?pd_[email or web address]&th=1  [email or web address]/  These dressing supplies should be available at local medical supply stores ([facility], [facility], etc). They are not usually carried at places like CVS, Walgreens, [facility], etc  Once the incision is completely dry and without drainage, it may be left open to air out.  Showering may begin 36-48 hours later.  Cleaning gently with soap and water.  Traumatic wounds should be dressed daily as well.    One layer of adaptic, gauze, Kerlix, then ace wrap.  The adaptic can be discontinued once the draining has ceased    If you have a wet to dry dressing: wet the  gauze with saline the squeeze as much saline out so the gauze is moist (not soaking wet), place moistened gauze over wound, then place a dry gauze over the moist one, followed by Kerlix wrap, then ace wrap.   DVT/PE prophylaxis: Lovenox  40 mg subcutaneous injection daily for 30 days for blood clot prevention  Diet: as you were eating previously.  Can  use over the counter stool softeners and bowel preparations, such as Miralax , to help with bowel movements.  Narcotics can be constipating.  Be sure to drink plenty of fluids  PAIN MEDICATION USE AND EXPECTATIONS  You have likely been given narcotic medications to help control your pain.  After a traumatic event that results in an fracture (broken bone) with or without surgery, it is ok to use narcotic pain medications to help control one's pain.  We understand that everyone responds to pain differently and each individual patient will be evaluated on a regular basis for the continued need for narcotic medications. Ideally, narcotic medication use should last no more than 6-8 weeks (coinciding with fracture healing).   As a patient it is your responsibility as well to monitor narcotic medication use and report the amount and frequency you use these medications when you come to your office visit.   We would also advise that if you are using narcotic medications, you should take a dose prior to therapy to maximize you participation.  IF YOU ARE ON NARCOTIC MEDICATIONS IT IS NOT PERMISSIBLE TO OPERATE A MOTOR VEHICLE (MOTORCYCLE/CAR/TRUCK/MOPED) OR HEAVY MACHINERY DO NOT MIX NARCOTICS WITH OTHER CNS (CENTRAL NERVOUS SYSTEM) DEPRESSANTS SUCH AS ALCOHOL    POST-OPERATIVE OPIOID TAPER INSTRUCTIONS: It is important to wean off of your opioid medication as soon as possible. If you do not need pain medication after your surgery it is ok to stop day one. Opioids include: Codeine, Hydrocodone(Norco, Vicodin), Oxycodone (Percocet, oxycontin ) and hydromorphone  amongst others.  Long term and even short term use of opiods can cause: Increased pain response Dependence Constipation Depression Respiratory depression And more.  Withdrawal symptoms can include Flu like symptoms Nausea, vomiting And more Techniques to manage these symptoms Hydrate well Eat regular healthy meals Stay active Use relaxation  techniques(deep breathing, meditating, yoga) Do Not substitute Alcohol  to help with tapering If you have been on opioids for less than two weeks and do not have pain than it is ok to stop all together.  Plan to wean off of opioids This plan should start within one week post op of your fracture surgery  Maintain the same interval or time between taking each dose and first decrease the dose.  Cut the total daily intake of opioids by one tablet each day Next start to increase the time between doses. The last dose that should be eliminated is the evening dose.    STOP SMOKING OR USING NICOTINE PRODUCTS!!!!  As discussed nicotine severely impairs your body's ability to heal surgical and traumatic wounds but also impairs bone healing.  Wounds and bone heal by forming microscopic blood vessels (angiogenesis) and nicotine is a vasoconstrictor (essentially, shrinks blood vessels).  Therefore, if vasoconstriction occurs to these microscopic blood vessels they essentially disappear and are unable to deliver necessary nutrients to the healing tissue.  This is one modifiable factor that you can do to dramatically increase your chances of healing your injury.    (This means no smoking, no nicotine gum, patches, etc)  DO NOT USE NONSTEROIDAL ANTI-INFLAMMATORY DRUGS (NSAID'S)  Using products such as Advil (ibuprofen), Aleve (naproxen),  Motrin (ibuprofen) for additional pain control during fracture healing can delay and/or prevent the healing response.  If you would like to take over the counter (OTC) medication, Tylenol  (acetaminophen ) is ok.  However, some narcotic medications that are given for pain control contain acetaminophen  as well. Therefore, you should not exceed more than 4000 mg of tylenol  in a day if you do not have liver disease.  Also note that there are may OTC medicines, such as cold medicines and allergy medicines that my contain tylenol  as well.  If you have any questions about medications and/or  interactions please ask your doctor/PA or your pharmacist.      ICE AND ELEVATE INJURED/OPERATIVE EXTREMITY  Using ice and elevating the injured extremity above your heart can help with swelling and pain control.  Icing in a pulsatile fashion, such as 20 minutes on and 20 minutes off, can be followed.    Do not place ice directly on skin. Make sure there is a barrier between to skin and the ice pack.    Using frozen items such as frozen peas works well as the conform nicely to the are that needs to be iced.  USE AN ACE WRAP OR TED HOSE FOR SWELLING CONTROL  In addition to icing and elevation, Ace wraps or TED hose are used to help limit and resolve swelling.  It is recommended to use Ace wraps or TED hose until you are informed to stop.    When using Ace Wraps start the wrapping distally (farthest away from the body) and wrap proximally (closer to the body)   Example: If you had surgery on your leg and you do not have a splint on, start the ace wrap at the toes and work your way up to the thigh        If you had surgery on your upper extremity and do not have a splint on, start the ace wrap at your fingers and work your way up to the upper arm  IF YOU ARE IN A SPLINT OR CAST DO NOT REMOVE IT FOR ANY REASON   If your splint gets wet for any reason please contact the office immediately. You may shower in your splint or cast as long as you keep it dry.  This can be done by wrapping in a cast cover or garbage back (or similar)  Do Not stick any thing down your splint or cast such as pencils, money, or hangers to try and scratch yourself with.  If you feel itchy take benadryl as prescribed on the bottle for itching  IF YOU ARE IN A CAM BOOT (BLACK BOOT)  You may remove boot periodically. Perform daily dressing changes as noted below.  Wash the liner of the boot regularly and wear a sock when wearing the boot. It is recommended that you sleep in the boot until told otherwise    Call office for the  following: Temperature greater than 101F Persistent nausea and vomiting Severe uncontrolled pain Redness, tenderness, or signs of infection (pain, swelling, redness, odor or green/yellow discharge around the site) Difficulty breathing, headache or visual disturbances Hives Persistent dizziness or light-headedness Extreme fatigue Any other questions or concerns you may have after discharge  In an emergency, call 911 or go to an Emergency Department at a nearby hospital  HELPFUL INFORMATION  If you had a block, it will wear off between 8-24 hrs postop typically.  This is period when your pain may go from nearly zero to the  pain you would have had postop without the block.  This is an abrupt transition but nothing dangerous is happening.  You may take an extra dose of narcotic when this happens.  You should wean off your narcotic medicines as soon as you are able.  Most patients will be off or using minimal narcotics before their first postop appointment.   We suggest you use the pain medication the first night prior to going to bed, in order to ease any pain when the anesthesia wears off. You should avoid taking pain medications on an empty stomach as it will make you nauseous.  Do not drink alcoholic beverages or take illicit drugs when taking pain medications.  In most states it is against the law to drive while you are in a splint or sling.  And certainly against the law to drive while taking narcotics.  You may return to work/school in the next couple of days when you feel up to it.   Pain medication may make you constipated.  Below are a few solutions to try in this order: Decrease the amount of pain medication if you arent having pain. Drink lots of decaffeinated fluids. Drink prune juice and/or each dried prunes  If the first 3 dont work start with additional solutions Take Colace - an over-the-counter stool softener Take Senokot - an over-the-counter laxative Take Miralax  -  a stronger over-the-counter laxative     CALL THE OFFICE WITH ANY QUESTIONS OR CONCERNS: [phone]   VISIT OUR WEBSITE FOR ADDITIONAL INFORMATION: [email or web address]    "

## 2024-05-22 NOTE — Progress Notes (Signed)
 Transition of Care [facility]) - CAGE-AID Screening   Patient Details  Name: Megan Mcmahon MRN: [id number] Date of Birth: 1950-12-18  [facility] CM/SW Contact:    Bernardino Mayotte, RN Phone Number: 05/22/2024, 6:40 AM   Clinical Narrative:  Patient denies the use of alcohol  and illicit substances. Resources not given at this time.  CAGE-AID Screening:    Have You Ever Felt You Ought to Cut Down on Your Drinking or Drug Use?: No Have People Annoyed You By Critizing Your Drinking Or Drug Use?: No Have You Felt Bad Or Guilty About Your Drinking Or Drug Use?: No Have You Ever Had a Drink or Used Drugs First Thing In The Morning to Steady Your Nerves or to Get Rid of a Hangover?: No CAGE-AID Score: 0  Substance Abuse Education Offered: No

## 2024-05-22 NOTE — Evaluation (Signed)
 Occupational Therapy Evaluation Patient Details Name: Megan Mcmahon MRN: [id number] DOB: 02/07/1951 Today's Date: 05/22/2024   History of Present Illness   Pt is a 74 y/o female presenting on 05/20/24 after fall.  Found with intertrochanteric fx of L femur. 1/13 S/P IM nail L hip, post op WBAT. PMH includes: CKD3, fibromyalgia, CAD, HTN, cataracts, baseline 4L O2 from COVID.     Clinical Impressions PTA patient independent with ADLs, IADLs, and mobilizing without AD. She did need assist for managing her hair per spouse.  Admitted for above and presents with problem list below. Pt currently requires total assist +2 for bed mobility, max assist +2 for transfers, and setup to total assist for ADLs.  She presents with slow processing, decreased problem solving; needing repetition at times in order to answer questions.  Spouse is very supportive.  Based on performance today, believe patient will best benefit from continued OT services acutely and after dc at inpatient setting with <3hrs/day to optimize independence, safety with ADLs and mobility.     If plan is discharge home, recommend the following:   Two people to help with walking and/or transfers;Two people to help with bathing/dressing/bathroom;Assistance with cooking/housework;Assist for transportation;Help with stairs or ramp for entrance;Direct supervision/assist for financial management;Direct supervision/assist for medications management;Supervision due to cognitive status     Functional Status Assessment   Patient has had a recent decline in their functional status and demonstrates the ability to make significant improvements in function in a reasonable and predictable amount of time.     Equipment Recommendations   Other (comment) (defer)     Recommendations for Other Services         Precautions/Restrictions   Precautions Precautions: Fall Restrictions Weight Bearing Restrictions Per Provider Order: Yes LLE  Weight Bearing Per Provider Order: Weight bearing as tolerated     Mobility Bed Mobility Overal bed mobility: Needs Assistance Bed Mobility: Supine to Sit     Supine to sit: Total assist, +2 for physical assistance          Transfers Overall transfer level: Needs assistance Equipment used: 2 person hand held assist Transfers: Sit to/from Stand Sit to Stand: Max assist, +2 physical assistance                  Balance Overall balance assessment: Needs assistance Sitting-balance support: No upper extremity supported, Feet supported Sitting balance-Leahy Scale: Fair Sitting balance - Comments: min assist fading to close supervision statically   Standing balance support: Bilateral upper extremity supported, During functional activity Standing balance-Leahy Scale: Poor Standing balance comment: relies on BUE and external support                           ADL either performed or assessed with clinical judgement   ADL Overall ADL's : Needs assistance/impaired     Grooming: Minimal assistance;Sitting           Upper Body Dressing : Sitting;Minimal assistance   Lower Body Dressing: Total assistance;+2 for physical assistance;Sit to/from stand   Toilet Transfer: Maximal assistance;+2 for physical assistance Toilet Transfer Details (indicate cue type and reason): bil hand held assist to recliner, 2+         Functional mobility during ADLs: Maximal assistance;+2 for physical assistance       Vision   Vision Assessment?: No apparent visual deficits     Perception         Praxis  Pertinent Vitals/Pain Pain Assessment Pain Assessment: Faces Faces Pain Scale: Hurts even more Pain Location: L hip with standing Pain Descriptors / Indicators: Discomfort, Grimacing, Operative site guarding Pain Intervention(s): Limited activity within patient's tolerance, Monitored during session, Repositioned     Extremity/Trunk Assessment Upper  Extremity Assessment Upper Extremity Assessment: Generalized weakness   Lower Extremity Assessment Lower Extremity Assessment: Defer to PT evaluation       Communication Communication Communication: Impaired Factors Affecting Communication: Difficulty expressing self   Cognition Arousal: Alert Behavior During Therapy: Flat affect Cognition: Cognition impaired             OT - Cognition Comments: pt following simple commands with increased time, very slow processing and at times need repetition for initation                 Following commands: Impaired Following commands impaired: Follows one step commands with increased time     Cueing  General Comments   Cueing Techniques: Verbal cues      Exercises     Shoulder Instructions      Home Living Family/patient expects to be discharged to:: Private residence Living Arrangements: Spouse/significant other Available Help at Discharge: Family;Available 24 hours/day Type of Home: House Home Access: Stairs to enter [employer] of Steps: 1   Home Layout: One level     Bathroom Shower/Tub: Chief Strategy Officer: Handicapped height     Home Equipment: Agricultural Consultant (2 wheels)   Additional Comments: on 4L O2 baseline      Prior Functioning/Environment Prior Level of Function : Independent/Modified Independent             Mobility Comments: no AD ADLs Comments: ind ADLs, IADLs. does need assist for managing her hair    OT Problem List: Decreased strength;Decreased activity tolerance;Impaired balance (sitting and/or standing);Pain;Obesity;Decreased knowledge of precautions;Decreased knowledge of use of DME or AE;Decreased safety awareness;Decreased cognition   OT Treatment/Interventions: Self-care/ADL training;Therapeutic exercise;DME and/or AE instruction;Therapeutic activities;Patient/family education;Balance training;Cognitive remediation/compensation      OT Goals(Current  goals can be found in the care plan section)   Acute Rehab OT Goals Patient Stated Goal: none stated OT Goal Formulation: Patient unable to participate in goal setting Time For Goal Achievement: 06/05/24 Potential to Achieve Goals: Good   OT Frequency:  Min 2X/week    Co-evaluation PT/OT/SLP Co-Evaluation/Treatment: Yes Reason for Co-Treatment: For patient/therapist safety;To address functional/ADL transfers PT goals addressed during session: Mobility/safety with mobility OT goals addressed during session: ADL's and self-care      AM-PAC OT 6 Clicks Daily Activity     Outcome Measure Help from another person eating meals?: A Little Help from another person taking care of personal grooming?: A Little Help from another person toileting, which includes using toliet, bedpan, or urinal?: Total Help from another person bathing (including washing, rinsing, drying)?: A Lot Help from another person to put on and taking off regular upper body clothing?: A Little Help from another person to put on and taking off regular lower body clothing?: Total 6 Click Score: 13   End of Session Equipment Utilized During Treatment: Gait belt Nurse Communication: Mobility status;Precautions  Activity Tolerance: Patient tolerated treatment well Patient left: in chair;with call bell/phone within reach;with chair alarm set;with nursing/sitter in room;with family/visitor present  OT Visit Diagnosis: Other abnormalities of gait and mobility (R26.89);Muscle weakness (generalized) (M62.81);Pain;Other symptoms and signs involving cognitive function Pain - Right/Left: Left Pain - part of body: Hip;Leg  Time: 9054-8980 OT Time Calculation (min): 34 min Charges:  OT General Charges $OT Visit: 1 Visit OT Evaluation $OT Eval Moderate Complexity: 1 Mod  Etta NOVAK, OT Acute Rehabilitation Services Office [phone] Secure Chat Preferred    Etta GORMAN Hope 05/22/2024, 12:05 PM

## 2024-05-22 NOTE — Progress Notes (Signed)
 Initial Nutrition Assessment  DOCUMENTATION CODES:   Morbid obesity  INTERVENTION:  Ensure Plus High Protein po BID, each supplement provides 350 kcal and 20 grams of protein.  Magic cup TID with meals, each supplement provides 290 kcal and 9 grams of protein  Encouraged adequate intake of meals and supplements to meet increased protein and calorie needs for post op recovery  Added dc nutrition information to AVS Attached Healthy Eating Nutrition Therapy handout Attached Protein Source in Different Foods handout to AVS  NUTRITION DIAGNOSIS:   Increased nutrient needs related to post-op healing as evidenced by estimated needs.  GOAL:   Patient will meet greater than or equal to 90% of their needs  MONITOR:   PO intake, Supplement acceptance  REASON FOR ASSESSMENT:   Consult Assessment of nutrition requirement/status  ASSESSMENT:   Pt with hx of HLD, HTN, GERD, CKD 3, chronic respiratory failure and anxiety/depression. Admitted post mechanical fall and suffered L hip fx.  1/12 admitted 1/13 s/p IMN of L hip  Spoke with pt and pt's husband at bedside. Pt appeared confused and lethargic still likely related to sedation from procedure yesterday. Pt unable to answer most questions but husband provided hx. Pt has not had any meals since surgery due to issues with swallowing this morning related to mentation. SLP consulted, but SLP reports cognition improved by afternoon and pt able to swallow with no issues. Will continue to monitor intake. Pt ordered dulcolax last night as she has not had bowel movement since last Thursday 1/8, will monitor to see if regimen helps.  Husband reports pt was eating well PTA. Eating about 3 meals per day. Breakfast and lunch tended to be lighter meals. Breakfast consisted of bacon, dry cereal. Lunch consisted of small sandwich (sometimes deli sometimes vegetable). Dinner was usually BBQ takeout or from [employer] but pt would also cook home cooked  meals which included meat, a vegetable, and starch. Pt does not like milk but does fine with other dairy products. Pt was taking MVI, vitamin D3 and calcium at home. Husband reports pt deals with chronic constipation due to IBS and possible gastroparesis. At home pt was on dulcolax regimen on Mondays and Thursdays to help constipation which seemed to work well for pt.   Pt reports issues seeing at nighttime and states her vision has not been very good recently. Husband reports pt does not wear prescribed glasses even when she needs them. Suspect vision issues related to noncompliance with glasses and not nutrient deficiency given supplement intake and no findings on physical exam.  Nutrition focused physical exam shows pt well nourished at baseline. Encouraged high protein intake to help with post op recovery and continued intake of vitamins to support bone health. Pt could benefit from Ensure supplement to support protein intake while appetite improving post op. Added dc nutrition information to AVS.  NUTRITION - FOCUSED PHYSICAL EXAM:  Flowsheet Row Most Recent Value  Orbital Region No depletion  Upper Arm Region No depletion  Thoracic and Lumbar Region No depletion  Buccal Region No depletion  Temple Region No depletion  Clavicle Bone Region No depletion  Clavicle and Acromion Bone Region No depletion  Scapular Bone Region No depletion  Dorsal Hand No depletion  Patellar Region No depletion  Anterior Thigh Region No depletion  Posterior Calf Region Unable to assess  [BLE]  Edema (RD Assessment) Mild  [BLE]  Hair Reviewed  Eyes Reviewed  Mouth Reviewed  Skin Reviewed  Nails Reviewed    Diet  Order:   Diet Order             Diet regular Room service appropriate? Yes; Fluid consistency: Thin  Diet effective now                   EDUCATION NEEDS:   Education needs have been addressed  Skin:  Skin Assessment: Skin Integrity Issues: Skin Integrity Issues::  Incisions Incisions: L leg  Last BM:  1/8 per pt/husband report  Height:   Ht Readings from Last 1 Encounters:  05/21/24 4' 9 (1.448 m)    Weight:   Wt Readings from Last 1 Encounters:  05/21/24 90.7 kg    Ideal Body Weight:  43.2 kg  BMI:  Body mass index is 43.28 kg/m.  Estimated Nutritional Needs:   Kcal:  1500-1800  Protein:  95-115g  Fluid:  1.5-1.8L    Josette Glance, MS, RDN, LDN Clinical Dietitian I Please reach out via secure chat

## 2024-05-22 NOTE — Evaluation (Signed)
 Clinical/Bedside Swallow Evaluation Patient Details  Name: Megan Mcmahon MRN: [id number] Date of Birth: 1951/02/04  Today's Date: 05/22/2024 Time: SLP Start Time (ACUTE ONLY): 1125 SLP Stop Time (ACUTE ONLY): 1135 SLP Time Calculation (min) (ACUTE ONLY): 10 min  Past Medical History:  Past Medical History:  Diagnosis Date   Chronic kidney disease, stage 3 (HCC)    COVID-19 long hauler manifesting chronic dyspnea    Continous oxygen  therapy   Fibromyalgia    GAD (generalized anxiety disorder)    GERD (gastroesophageal reflux disease)    Hyperlipidemia    Hypertension    Hypothyroidism    Past Surgical History:  Past Surgical History:  Procedure Laterality Date   CATARACT EXTRACTION, BILATERAL     DILATION AND CURETTAGE OF UTERUS  2000   HPI:  This 74 yrs old female presented in the ED status post mechanical fall with comminuted , displaced and impacted intertrochanteric fracture of left femur. ORIF on 1/13.    Assessment / Plan / Recommendation  Clinical Impression  Pt demonstrates no signs of dysphagia and has low risk of aspiration now that her arousal is improving. Pt and family counseled to consider nutritional needs. Offered an Ensure. Will consult dietitian if not yet involved. SLP will sign off.      Aspiration Risk    low   Diet Recommendation    Regular/thin       Other Recommendations Oral Care Recommendations: Patient independent with oral care     Swallow Evaluation Recommendations Recommendations: PO diet PO Diet Recommendation: Regular;Thin liquids (Level 0) Liquid Administration via: Cup;Straw Medication Administration: Whole meds with liquid Supervision: Intermittent supervision/cueing for swallowing strategies Swallowing strategies  : Slow rate;Small bites/sips Postural changes: Position pt fully upright for meals   Assistance Recommended at Discharge    Functional Status Assessment    Frequency and Duration            Prognosis         Swallow Study   General HPI: This 74 yrs old female presented in the ED status post mechanical fall with comminuted , displaced and impacted intertrochanteric fracture of left femur. ORIF on 1/13. Type of Study: Bedside Swallow Evaluation Previous Swallow Assessment: noen Diet Prior to this Study: Regular;Thin liquids (Level 0) Temperature Spikes Noted: No History of Recent Intubation: Yes Total duration of intubation (days): 1 days Date extubated: 05/21/24 Behavior/Cognition: Alert;Cooperative;Pleasant mood Oral Cavity Assessment: Within Functional Limits Oral Care Completed by SLP: No Oral Cavity - Dentition: Adequate natural dentition Vision: Functional for self-feeding Self-Feeding Abilities: Needs assist Patient Positioning: Upright in chair Baseline Vocal Quality: Normal Volitional Cough: Strong Volitional Swallow: Able to elicit    Oral/Motor/Sensory Function Overall Oral Motor/Sensory Function: Within functional limits   Ice Chips     Thin Liquid Thin Liquid: Within functional limits    Nectar Thick Nectar Thick Liquid: Not tested   Honey Thick Honey Thick Liquid: Not tested   Puree Puree: Not tested   Solid     Solid: Within functional limits      Megan Mcmahon, Megan Mcmahon 05/22/2024,1:55 PM

## 2024-05-22 NOTE — Plan of Care (Signed)
" °  Problem: Education: Goal: Knowledge of General Education information will improve Description: Including pain rating scale, medication(s)/side effects and non-pharmacologic comfort measures Outcome: Progressing   Problem: Health Behavior/Discharge Planning: Goal: Ability to manage health-related needs will improve Outcome: Progressing   Problem: Clinical Measurements: Goal: Ability to maintain clinical measurements within normal limits will improve Outcome: Progressing Goal: Will remain free from infection Outcome: Progressing Goal: Diagnostic test results will improve Outcome: Progressing Goal: Respiratory complications will improve Outcome: Progressing Goal: Cardiovascular complication will be avoided Outcome: Progressing   Problem: Activity: Goal: Risk for activity intolerance will decrease Outcome: Progressing   Problem: Nutrition: Goal: Adequate nutrition will be maintained Outcome: Progressing   Problem: Coping: Goal: Level of anxiety will decrease Outcome: Progressing   Problem: Elimination: Goal: Will not experience complications related to bowel motility Outcome: Progressing Goal: Will not experience complications related to urinary retention Outcome: Progressing   Problem: Pain Managment: Goal: General experience of comfort will improve and/or be controlled Outcome: Progressing   Problem: Safety: Goal: Ability to remain free from injury will improve Outcome: Progressing   Problem: Skin Integrity: Goal: Risk for impaired skin integrity will decrease Outcome: Progressing   Problem: Education: Goal: Knowledge of the prescribed therapeutic regimen will improve Outcome: Progressing   Problem: Bowel/Gastric: Goal: Gastrointestinal status for postoperative course will improve Outcome: Progressing   Problem: Cardiac: Goal: Ability to maintain an adequate cardiac output Outcome: Progressing Goal: Will show no evidence of cardiac arrhythmias Outcome:  Progressing   Problem: Nutritional: Goal: Will attain and maintain optimal nutritional status Outcome: Progressing   Problem: Neurological: Goal: Will regain or maintain usual level of consciousness Outcome: Progressing   Problem: Clinical Measurements: Goal: Ability to maintain clinical measurements within normal limits Outcome: Progressing Goal: Postoperative complications will be avoided or minimized Outcome: Progressing   Problem: Respiratory: Goal: Will regain and/or maintain adequate ventilation Outcome: Progressing Goal: Respiratory status will improve Outcome: Progressing   Problem: Skin Integrity: Goal: Demonstrates signs of wound healing without infection Outcome: Progressing   Problem: Urinary Elimination: Goal: Will remain free from infection Outcome: Progressing Goal: Ability to achieve and maintain adequate urine output Outcome: Progressing   Problem: Education: Goal: Verbalization of understanding the information provided (i.e., activity precautions, restrictions, etc) will improve Outcome: Progressing Goal: Individualized Educational Video(s) Outcome: Progressing   Problem: Activity: Goal: Ability to ambulate and perform ADLs will improve Outcome: Progressing   Problem: Clinical Measurements: Goal: Postoperative complications will be avoided or minimized Outcome: Progressing   Problem: Self-Concept: Goal: Ability to maintain and perform role responsibilities to the fullest extent possible will improve Outcome: Progressing   Problem: Pain Management: Goal: Pain level will decrease Outcome: Progressing   "

## 2024-05-22 NOTE — Progress Notes (Signed)
 " PROGRESS NOTE    Megan Mcmahon  FMW:[id number] DOB: 06/27/1950 DOA: 05/20/2024 PCP: Trudy Vaughn FALCON, MD   Brief Narrative:  This 74 yrs old female presented in the ED status post mechanical fall with comminuted , displaced and impacted intertrochanteric fracture of left femur. Patient reports while going to take nighttime medicine,  Patient missed step and fell, did not hit her head, she was not dizzy and did not lose consciousness. Xray showed  Acute markedly comminuted, displaced, and impacted intertrochanteric fracture of the left proximal femur with medially displaced distal fracture fragment.  She was admitted for further evaluation orthopedics is consulted status post ORIF today POD #1.   Assessment & Plan:   Principal Problem:   Femur fracture (HCC) Active Problems:   Chronic respiratory failure with hypoxia and hypercapnia (HCC)   Morbid obesity due to excess calories (HCC)   Closed fracture dislocation of left hip joint (HCC)   Fall at home  Left hip fracture status post mechanical fall: Orthopedics consult appreciated. Patient is s/p ORIF 05/21/24 .  POD #1 As needed IV and oral opiates as ordered. WBAT left leg with assistance. PT and OT likely need SNF.   Essential HTN--stable, continue scheduled Toprol -XL May use IV hydralazine  as needed elevated BP.   Chronic hypoxic respiratory failure: Patient is chronically on 2 L via nasal cannula at home.  Hypothyroidism: Continue Levothyroxine  50 mcg daily.   HLD: Continue Pravastatin .   Depression/Anxiety :  stable, Continue scheduled Cymbalta  and as needed Xanax .   GERD: Continue Protonix  40 daily.   Morbid Obesity- -Low calorie diet, portion control and increase physical activity discussed with patient after resolution of left hip issues -Body mass index is 43.28 kg/m.  DVT prophylaxis: Lovenox  Code Status:Full code Family Communication: No family at bed side Disposition Plan:  Status is: Inpatient Remains  inpatient appropriate because: Admitted status post mechanical fall with left femur fracture,  orthopedics is consulted and underwent ORIF.  POD #1.  PT and OT pending for SNF placement.   Consultants:  Orthopaedics  Procedures: ORIF  Antimicrobials: Anti-infectives (From admission, onward)    Start     Dose/Rate Route Frequency Ordered Stop   05/21/24 1800  ceFAZolin  (ANCEF ) IVPB 2g/100 mL premix        2 g 200 mL/hr over 30 Minutes Intravenous Every 6 hours 05/21/24 1516 05/21/24 2340      Subjective: Patient was seen and examined at bedside. Overnight events noted. Patient is status post ORIF yesterday.  POD #1. Patient reports pain is reasonably controlled.   Objective: Vitals:   05/21/24 1934 05/21/24 2306 05/22/24 0259 05/22/24 0855  BP: 100/62 (!) 103/58 (!) 113/56 (!) 118/57  Pulse: (!) 106 (!) 111 (!) 118 (!) 109  Resp: 15 15 18 19   Temp: 97.8 F (36.6 C) 98.1 F (36.7 C) 99.2 F (37.3 C) 98.7 F (37.1 C)  TempSrc: Oral Oral Oral Axillary  SpO2: 97% 96% 98% 96%  Weight:      Height:        Intake/Output Summary (Last 24 hours) at 05/22/2024 1128 Last data filed at 05/22/2024 0501 Gross per 24 hour  Intake 1750 ml  Output 1675 ml  Net 75 ml   Filed Weights   05/20/24 0100 05/21/24 0902  Weight: 90.7 kg 90.7 kg    Examination:  General exam: Appears calm and comfortable, not in any acute distress.  Respiratory system: CTA Bilaterally . Respiratory effort normal.  RR 15 Cardiovascular system: S1 &  S2 heard, RRR. No JVD, murmurs, rubs, gallops or clicks. No pedal edema. Gastrointestinal system: Abdomen is non distended, soft and non tender. Normal bowel sounds heard. Central nervous system: Alert and oriented x 3. No focal neurological deficits. Extremities: S/P ORIF, left knee tenderness+ Skin: No rashes, lesions or ulcers Psychiatry: Judgement and insight appear normal. Mood & affect appropriate.    Data Reviewed: I have personally reviewed  following labs and imaging studies  CBC: Recent Labs  Lab 05/20/24 0215 05/21/24 0622 05/22/24 0545  WBC 20.1* 9.9 12.1*  NEUTROABS 17.5*  --   --   HGB 11.9* 9.3* 8.0*  HCT 37.9 28.8* 24.5*  MCV 91.3 88.9 88.8  PLT 197 150 148*   Basic Metabolic Panel: Recent Labs  Lab 05/20/24 0215 05/21/24 0622 05/22/24 0545  NA 135 131* 134*  K 3.8 4.1 4.0  CL 90* 89* 93*  CO2 33* 37* 34*  GLUCOSE 115* 120* 131*  BUN 10 11 8   CREATININE 0.73 0.73 0.75  CALCIUM 10.0 9.0 8.8*   GFR: Estimated Creatinine Clearance: 60.5 mL/min (by C-G formula based on SCr of 0.75 mg/dL). Liver Function Tests: Recent Labs  Lab 05/21/24 0622  AST 23  ALT 10  ALKPHOS 51  BILITOT 0.5  PROT 5.8*  ALBUMIN  3.6   No results for input(s): LIPASE, AMYLASE in the last 168 hours. No results for input(s): AMMONIA in the last 168 hours. Coagulation Profile: Recent Labs  Lab 05/20/24 0215  INR 0.9   Cardiac Enzymes: No results for input(s): CKTOTAL, CKMB, CKMBINDEX, TROPONINI in the last 168 hours. BNP (last 3 results) No results for input(s): PROBNP in the last 8760 hours. HbA1C: No results for input(s): HGBA1C in the last 72 hours. CBG: No results for input(s): GLUCAP in the last 168 hours. Lipid Profile: No results for input(s): CHOL, HDL, LDLCALC, TRIG, CHOLHDL, LDLDIRECT in the last 72 hours. Thyroid  Function Tests: No results for input(s): TSH, T4TOTAL, FREET4, T3FREE, THYROIDAB in the last 72 hours. Anemia Panel: No results for input(s): VITAMINB12, FOLATE, FERRITIN, TIBC, IRON, RETICCTPCT in the last 72 hours. Sepsis Labs: No results for input(s): PROCALCITON, LATICACIDVEN in the last 168 hours.  Recent Results (from the past 240 hours)  Surgical pcr screen     Status: None   Collection Time: 05/21/24  6:11 AM   Specimen: Nasal Mucosa; Nasal Swab  Result Value Ref Range Status   MRSA, PCR NEGATIVE NEGATIVE Final    Staphylococcus aureus NEGATIVE NEGATIVE Final    Comment: (NOTE) The Xpert SA Assay (FDA approved for NASAL specimens in patients 24 years of age and older), is one component of a comprehensive surveillance program. It is not intended to diagnose infection nor to guide or monitor treatment. Performed at [facility], [address] [place], [place] [address]     Radiology Studies: DG FEMUR PORT MIN 2 VIEWS LEFT Result Date: 05/21/2024 CLINICAL DATA:  Postoperative left femoral fracture. EXAM: LEFT FEMUR PORTABLE 2 VIEWS COMPARISON:  Left hip radiograph dated 05/20/2024. FINDINGS: Status post ORIF of comminuted left femoral fracture. The hardware appears intact. No dislocation. The soft tissues are unremarkable. IMPRESSION: Status post ORIF of comminuted left femoral fracture. Electronically Signed   By: Vanetta Chou M.D.   On: 05/21/2024 16:57   DG FEMUR MIN 2 VIEWS LEFT Result Date: 05/21/2024 CLINICAL DATA:  Left femoral neck fracture.  Surgery. EXAM: LEFT FEMUR 2 VIEWS COMPARISON:  Radiograph dated 05/20/2024. FINDINGS: Intraoperative fluoroscopic spot images of the left hip  provided. The total fluoroscopic time is 88.5 seconds with a cumulative air kerma of 25.27 mGy. Status post ORIF of the left femoral neck fracture. IMPRESSION: Status post ORIF of the left femoral neck fracture. Electronically Signed   By: Vanetta Chou M.D.   On: 05/21/2024 16:47   DG C-Arm 1-60 Min-No Report Result Date: 05/21/2024 Fluoroscopy was utilized by the requesting physician.  No radiographic interpretation.   DG C-Arm 1-60 Min-No Report Result Date: 05/21/2024 Fluoroscopy was utilized by the requesting physician.  No radiographic interpretation.    Scheduled Meds:  cholecalciferol   2,000 Units Oral BID   docusate sodium   100 mg Oral BID   DULoxetine   60 mg Oral Daily   fesoterodine   4 mg Oral Daily   heparin  injection (subcutaneous)  5,000 Units Subcutaneous Q8H   heparin  injection  (subcutaneous)  5,000 Units Subcutaneous Once   levothyroxine   50 mcg Oral Q0600   methocarbamol   500 mg Oral TID   metoprolol  succinate  75 mg Oral Daily   ondansetron  (ZOFRAN ) IV  4 mg Intravenous Once   pantoprazole   40 mg Oral Daily   pravastatin   20 mg Oral Daily   sodium chloride  flush  3 mL Intravenous Q12H   sodium chloride  flush  3 mL Intravenous Q12H   Continuous Infusions:     LOS: 2 days    Time spent: 35 mins    Darcel Dawley, MD [facility]   If 7PM-7AM, please contact night-coverage  "

## 2024-05-23 DIAGNOSIS — S72352A Displaced comminuted fracture of shaft of left femur, initial encounter for closed fracture: Secondary | ICD-10-CM | POA: Diagnosis not present

## 2024-05-23 LAB — CBC
HCT: 24.7 % — ABNORMAL LOW (ref 36.0–46.0)
Hemoglobin: 7.9 g/dL — ABNORMAL LOW (ref 12.0–15.0)
MCH: 29 pg (ref 26.0–34.0)
MCHC: 32 g/dL (ref 30.0–36.0)
MCV: 90.8 fL (ref 80.0–100.0)
Platelets: 150 K/uL (ref 150–400)
RBC: 2.72 MIL/uL — ABNORMAL LOW (ref 3.87–5.11)
RDW: 13.5 % (ref 11.5–15.5)
WBC: 11.3 K/uL — ABNORMAL HIGH (ref 4.0–10.5)
nRBC: 0.3 % — ABNORMAL HIGH (ref 0.0–0.2)

## 2024-05-23 LAB — BASIC METABOLIC PANEL WITH GFR
Anion gap: 8 (ref 5–15)
BUN: 9 mg/dL (ref 8–23)
CO2: 35 mmol/L — ABNORMAL HIGH (ref 22–32)
Calcium: 9.1 mg/dL (ref 8.9–10.3)
Chloride: 92 mmol/L — ABNORMAL LOW (ref 98–111)
Creatinine, Ser: 0.65 mg/dL (ref 0.44–1.00)
GFR, Estimated: >60 mL/min
Glucose, Bld: 120 mg/dL — ABNORMAL HIGH (ref 70–99)
Potassium: 4.2 mmol/L (ref 3.5–5.1)
Sodium: 136 mmol/L (ref 135–145)

## 2024-05-23 LAB — PHOSPHORUS: Phosphorus: 2 mg/dL — ABNORMAL LOW (ref 2.5–4.6)

## 2024-05-23 LAB — MAGNESIUM: Magnesium: 1.9 mg/dL (ref 1.7–2.4)

## 2024-05-23 MED ORDER — K PHOS MONO-SOD PHOS DI & MONO 155-852-130 MG PO TABS
250.0000 mg | ORAL_TABLET | Freq: Two times a day (BID) | ORAL | Status: AC
  Administered 2024-05-23 – 2024-05-25 (×6): 250 mg via ORAL
  Filled 2024-05-23 (×6): qty 1

## 2024-05-23 NOTE — Care Management Important Message (Signed)
 Important Message  Patient Details  Name: Megan Mcmahon MRN: [id number] Date of Birth: [birth year]   Important Message Given:  Yes - Medicare IM     Jennie Laneta Dragon 05/23/2024, 3:08 PM

## 2024-05-23 NOTE — Plan of Care (Signed)
" °  Problem: Education: Goal: Knowledge of General Education information will improve Description: Including pain rating scale, medication(s)/side effects and non-pharmacologic comfort measures Outcome: Progressing   Problem: Health Behavior/Discharge Planning: Goal: Ability to manage health-related needs will improve Outcome: Progressing   Problem: Clinical Measurements: Goal: Ability to maintain clinical measurements within normal limits will improve Outcome: Progressing Goal: Will remain free from infection Outcome: Progressing Goal: Diagnostic test results will improve Outcome: Progressing Goal: Respiratory complications will improve Outcome: Progressing Goal: Cardiovascular complication will be avoided Outcome: Progressing   Problem: Activity: Goal: Risk for activity intolerance will decrease Outcome: Progressing   Problem: Nutrition: Goal: Adequate nutrition will be maintained Outcome: Progressing   Problem: Coping: Goal: Level of anxiety will decrease Outcome: Progressing   Problem: Elimination: Goal: Will not experience complications related to bowel motility Outcome: Progressing Goal: Will not experience complications related to urinary retention Outcome: Progressing   Problem: Pain Managment: Goal: General experience of comfort will improve and/or be controlled Outcome: Progressing   Problem: Safety: Goal: Ability to remain free from injury will improve Outcome: Progressing   Problem: Skin Integrity: Goal: Risk for impaired skin integrity will decrease Outcome: Progressing   Problem: Education: Goal: Knowledge of the prescribed therapeutic regimen will improve Outcome: Progressing   Problem: Bowel/Gastric: Goal: Gastrointestinal status for postoperative course will improve Outcome: Progressing   Problem: Cardiac: Goal: Ability to maintain an adequate cardiac output Outcome: Progressing Goal: Will show no evidence of cardiac arrhythmias Outcome:  Progressing   Problem: Nutritional: Goal: Will attain and maintain optimal nutritional status Outcome: Progressing   Problem: Neurological: Goal: Will regain or maintain usual level of consciousness Outcome: Progressing   Problem: Clinical Measurements: Goal: Ability to maintain clinical measurements within normal limits Outcome: Progressing Goal: Postoperative complications will be avoided or minimized Outcome: Progressing   Problem: Respiratory: Goal: Will regain and/or maintain adequate ventilation Outcome: Progressing Goal: Respiratory status will improve Outcome: Progressing   Problem: Skin Integrity: Goal: Demonstrates signs of wound healing without infection Outcome: Progressing   Problem: Urinary Elimination: Goal: Will remain free from infection Outcome: Progressing Goal: Ability to achieve and maintain adequate urine output Outcome: Progressing   Problem: Education: Goal: Verbalization of understanding the information provided (i.e., activity precautions, restrictions, etc) will improve Outcome: Progressing Goal: Individualized Educational Video(s) Outcome: Progressing   Problem: Activity: Goal: Ability to ambulate and perform ADLs will improve Outcome: Progressing   Problem: Clinical Measurements: Goal: Postoperative complications will be avoided or minimized Outcome: Progressing   Problem: Self-Concept: Goal: Ability to maintain and perform role responsibilities to the fullest extent possible will improve Outcome: Progressing   Problem: Pain Management: Goal: Pain level will decrease Outcome: Progressing   "

## 2024-05-23 NOTE — Progress Notes (Signed)
 " PROGRESS NOTE    Megan Mcmahon  FMW:[id number] DOB: Apr 22, 1951 DOA: 05/20/2024 PCP: Trudy Vaughn FALCON, MD   Brief Narrative:  This 74 yrs old female presented in the ED status post mechanical fall with comminuted , displaced and impacted intertrochanteric fracture of left femur. Patient reports while going to take nighttime medicine,  Patient missed step and fell, did not hit her head, she was not dizzy and did not lose consciousness. Xray showed  Acute markedly comminuted, displaced, and impacted intertrochanteric fracture of the left proximal femur with medially displaced distal fracture fragment.  She was admitted for further evaluation orthopedics is consulted status post ORIF today POD #1.   Assessment & Plan:   Principal Problem:   Femur fracture (HCC) Active Problems:   Chronic respiratory failure with hypoxia and hypercapnia (HCC)   Morbid obesity due to excess calories (HCC)   Closed fracture dislocation of left hip joint (HCC)   Fall at home  Left hip fracture status post mechanical fall: Orthopedics consult appreciated. Patient is s/p ORIF 05/21/24 .  POD # 2 Continue As needed IV and oral opiates . WBAT left leg with assistance. PT and OT likely need SNF.   Essential HTN--stable, continue scheduled Toprol -XL May use IV hydralazine  as needed for elevated BP.   Chronic hypoxic respiratory failure: Patient is chronically on 2 L via nasal cannula at home.  Hypothyroidism: Continue Levothyroxine  50 mcg daily.   HLD: Continue Pravastatin .   Depression/Anxiety :  stable, Continue scheduled Cymbalta  and as needed Xanax .   GERD: Continue Protonix  40 daily.   Morbid Obesity- -Low calorie diet, portion control and increase physical activity discussed with patient after resolution of left hip issues -Body mass index is 43.28 kg/m.  Hypophosphatemia: Replaced.  Continue to monitor  DVT prophylaxis: Lovenox  Code Status:Full code Family Communication: Family at bed  side. Disposition Plan:  Status is: Inpatient Remains inpatient appropriate because: Admitted status post mechanical fall with left femur fracture,  orthopedics is consulted and underwent ORIF.  POD # 2.  PT and OT recommended SNF placement.  Patient is now medically ready for discharge.   Consultants:  Orthopaedics  Procedures: ORIF  Antimicrobials: Anti-infectives (From admission, onward)    Start     Dose/Rate Route Frequency Ordered Stop   05/21/24 1800  ceFAZolin  (ANCEF ) IVPB 2g/100 mL premix        2 g 200 mL/hr over 30 Minutes Intravenous Every 6 hours 05/21/24 1516 05/21/24 2340      Subjective: Patient was seen and examined at bedside. Overnight events noted. Patient is status post ORIF.  POD # 2. Patient reports pain is reasonably controlled.  She was sitting comfortably on the recliner.   Objective: Vitals:   05/22/24 1940 05/22/24 2336 05/23/24 0344 05/23/24 0800  BP:  121/76 134/81 126/82  Pulse:  (!) 120 (!) 123 (!) 119  Resp:  15 16 18   Temp: 98.8 F (37.1 C) 99.1 F (37.3 C) 100.1 F (37.8 C) 98.3 F (36.8 C)  TempSrc:  Oral Oral Oral  SpO2:  96% 96% 95%  Weight:      Height:        Intake/Output Summary (Last 24 hours) at 05/23/2024 1121 Last data filed at 05/23/2024 0830 Gross per 24 hour  Intake 240 ml  Output --  Net 240 ml   Filed Weights   05/20/24 0100 05/21/24 0902  Weight: 90.7 kg 90.7 kg    Examination:  General exam: Appears calm and comfortable, not  in any acute distress.  Respiratory system: CTA Bilaterally. Respiratory effort normal.  RR 14 Cardiovascular system: S1 & S2 heard, RRR. No JVD, murmurs, rubs, gallops or clicks. No pedal edema. Gastrointestinal system: Abdomen is non distended, soft and non tender. Normal bowel sounds heard. Central nervous system: Alert and oriented x 3. No focal neurological deficits. Extremities: S/P ORIF, left knee tenderness+ Skin: No rashes, lesions or ulcers Psychiatry: Judgement and  insight appear normal. Mood & affect appropriate.    Data Reviewed: I have personally reviewed following labs and imaging studies  CBC: Recent Labs  Lab 05/20/24 0215 05/21/24 0622 05/22/24 0545 05/23/24 0511  WBC 20.1* 9.9 12.1* 11.3*  NEUTROABS 17.5*  --   --   --   HGB 11.9* 9.3* 8.0* 7.9*  HCT 37.9 28.8* 24.5* 24.7*  MCV 91.3 88.9 88.8 90.8  PLT 197 150 148* 150   Basic Metabolic Panel: Recent Labs  Lab 05/20/24 0215 05/21/24 0622 05/22/24 0545 05/23/24 0511  NA 135 131* 134* 136  K 3.8 4.1 4.0 4.2  CL 90* 89* 93* 92*  CO2 33* 37* 34* 35*  GLUCOSE 115* 120* 131* 120*  BUN 10 11 8 9   CREATININE 0.73 0.73 0.75 0.65  CALCIUM 10.0 9.0 8.8* 9.1  MG  --   --   --  1.9  PHOS  --   --   --  2.0*   GFR: Estimated Creatinine Clearance: 60.5 mL/min (by C-G formula based on SCr of 0.65 mg/dL). Liver Function Tests: Recent Labs  Lab 05/21/24 0622  AST 23  ALT 10  ALKPHOS 51  BILITOT 0.5  PROT 5.8*  ALBUMIN  3.6   No results for input(s): LIPASE, AMYLASE in the last 168 hours. No results for input(s): AMMONIA in the last 168 hours. Coagulation Profile: Recent Labs  Lab 05/20/24 0215  INR 0.9   Cardiac Enzymes: No results for input(s): CKTOTAL, CKMB, CKMBINDEX, TROPONINI in the last 168 hours. BNP (last 3 results) No results for input(s): PROBNP in the last 8760 hours. HbA1C: No results for input(s): HGBA1C in the last 72 hours. CBG: No results for input(s): GLUCAP in the last 168 hours. Lipid Profile: No results for input(s): CHOL, HDL, LDLCALC, TRIG, CHOLHDL, LDLDIRECT in the last 72 hours. Thyroid  Function Tests: No results for input(s): TSH, T4TOTAL, FREET4, T3FREE, THYROIDAB in the last 72 hours. Anemia Panel: No results for input(s): VITAMINB12, FOLATE, FERRITIN, TIBC, IRON, RETICCTPCT in the last 72 hours. Sepsis Labs: No results for input(s): PROCALCITON, LATICACIDVEN in the last 168  hours.  Recent Results (from the past 240 hours)  Surgical pcr screen     Status: None   Collection Time: 05/21/24  6:11 AM   Specimen: Nasal Mucosa; Nasal Swab  Result Value Ref Range Status   MRSA, PCR NEGATIVE NEGATIVE Final   Staphylococcus aureus NEGATIVE NEGATIVE Final    Comment: (NOTE) The Xpert SA Assay (FDA approved for NASAL specimens in patients 70 years of age and older), is one component of a comprehensive surveillance program. It is not intended to diagnose infection nor to guide or monitor treatment. Performed at [facility], [address] [place], [place] [address]     Radiology Studies: DG FEMUR PORT MIN 2 VIEWS LEFT Result Date: 05/21/2024 CLINICAL DATA:  Postoperative left femoral fracture. EXAM: LEFT FEMUR PORTABLE 2 VIEWS COMPARISON:  Left hip radiograph dated 05/20/2024. FINDINGS: Status post ORIF of comminuted left femoral fracture. The hardware appears intact. No dislocation. The soft tissues are  unremarkable. IMPRESSION: Status post ORIF of comminuted left femoral fracture. Electronically Signed   By: Vanetta Chou M.D.   On: 05/21/2024 16:57   DG FEMUR MIN 2 VIEWS LEFT Result Date: 05/21/2024 CLINICAL DATA:  Left femoral neck fracture.  Surgery. EXAM: LEFT FEMUR 2 VIEWS COMPARISON:  Radiograph dated 05/20/2024. FINDINGS: Intraoperative fluoroscopic spot images of the left hip provided. The total fluoroscopic time is 88.5 seconds with a cumulative air kerma of 25.27 mGy. Status post ORIF of the left femoral neck fracture. IMPRESSION: Status post ORIF of the left femoral neck fracture. Electronically Signed   By: Vanetta Chou M.D.   On: 05/21/2024 16:47   DG C-Arm 1-60 Min-No Report Result Date: 05/21/2024 Fluoroscopy was utilized by the requesting physician.  No radiographic interpretation.   DG C-Arm 1-60 Min-No Report Result Date: 05/21/2024 Fluoroscopy was utilized by the requesting physician.  No radiographic interpretation.    Scheduled  Meds:  cholecalciferol   2,000 Units Oral BID   docusate sodium   100 mg Oral BID   DULoxetine   60 mg Oral Daily   feeding supplement  237 mL Oral BID BM   fesoterodine   4 mg Oral Daily   heparin  injection (subcutaneous)  5,000 Units Subcutaneous Q8H   heparin  injection (subcutaneous)  5,000 Units Subcutaneous Once   levothyroxine   50 mcg Oral Q0600   methocarbamol   500 mg Oral TID   metoprolol  succinate  75 mg Oral Daily   ondansetron  (ZOFRAN ) IV  4 mg Intravenous Once   pantoprazole   40 mg Oral Daily   phosphorus  250 mg Oral BID   pravastatin   20 mg Oral Daily   sodium chloride  flush  3 mL Intravenous Q12H   sodium chloride  flush  3 mL Intravenous Q12H   Continuous Infusions:    LOS: 3 days    Time spent: 35 mins    Darcel Dawley, MD [facility]   If 7PM-7AM, please contact night-coverage  "

## 2024-05-23 NOTE — NC FL2 (Signed)
 " [place]  MEDICAID FL2 LEVEL OF CARE FORM     IDENTIFICATION  Patient Name: Megan Mcmahon Birthdate: 1950-06-15 Sex: female Admission Date (Current Location): 05/20/2024  [facility] and [place] Number:  [employer] and Address:  [facility], [address] [place], [place] [address]      Provider Number: [id number]  Attending Physician Name and Address:  Leotis Bogus, MD  Relative Name and Phone Number:       Current Level of Care: Hospital Recommended Level of Care: Skilled Nursing Facility Prior Approval Number:    Date Approved/Denied:   PASRR Number: [id number]  Discharge Plan: SNF    Current Diagnoses: Patient Active Problem List   Diagnosis Date Noted   Femur fracture (HCC) 05/20/2024   Closed fracture dislocation of left hip joint (HCC) 05/20/2024   Fall at home 05/20/2024   Morbid obesity due to excess calories (HCC) 06/08/2022   Chronic respiratory failure with hypoxia and hypercapnia (HCC) 07/17/2020   Upper airway cough syndrome 07/17/2020   DOE (dyspnea on exertion) 07/16/2020    Orientation RESPIRATION BLADDER Height & Weight     Self, Time, Situation, Place  O2 Continent, External catheter Weight: 200 lb (90.7 kg) Height:  4' 9 (144.8 cm)  BEHAVIORAL SYMPTOMS/MOOD NEUROLOGICAL BOWEL NUTRITION STATUS      Continent Diet (please see discharge summary)  AMBULATORY STATUS COMMUNICATION OF NEEDS Skin   Extensive Assist Verbally Surgical wounds (surgical closed incision Left Leg)                       Personal Care Assistance Level of Assistance  Bathing, Feeding, Dressing Bathing Assistance: Limited assistance Feeding assistance: Independent Dressing Assistance: Maximum assistance     Functional Limitations Info  Sight, Hearing, Speech Sight Info: Impaired (glasses) Hearing Info: Impaired Speech Info: Adequate    SPECIAL CARE FACTORS FREQUENCY  PT (By licensed PT), OT (By licensed OT)     PT  Frequency: 5x per week OT Frequency: 5x per week            Contractures Contractures Info: Not present    Additional Factors Info  Code Status, Allergies Code Status Info: FULL Allergies Info: Other High  Rash Catgut sutures  Wound Dressing Adhesive High  Rash   Codeine Not Specified   vomiting           Current Medications (05/23/2024):  This is the current hospital active medication list Current Facility-Administered Medications  Medication Dose Route Frequency Provider Last Rate Last Admin   acetaminophen  (TYLENOL ) tablet 650 mg  650 mg Oral Q6H PRN Deward Eck, PA-C       Or   acetaminophen  (TYLENOL ) suppository 650 mg  650 mg Rectal Q6H PRN Deward Eck, PA-C       ALPRAZolam  (XANAX ) tablet 0.5 mg  0.5 mg Oral Once PRN Deward Eck, PA-C       bisacodyl  (DULCOLAX) suppository 10 mg  10 mg Rectal Daily PRN Deward Eck, PA-C       cholecalciferol  (VITAMIN D3) 25 MCG (1000 UNIT) tablet 2,000 Units  2,000 Units Oral BID Deward Eck, PA-C   2,000 Units at 05/23/24 1005   docusate sodium  (COLACE) capsule 100 mg  100 mg Oral BID Deward Eck, PA-C   100 mg at 05/23/24 1005   DULoxetine  (CYMBALTA ) DR capsule 60 mg  60 mg Oral Daily Deward Eck, PA-C   60 mg at 05/23/24 1005   feeding supplement (ENSURE  PLUS HIGH PROTEIN) liquid 237 mL  237 mL Oral BID BM Leotis Bogus, MD   237 mL at 05/23/24 1351   fesoterodine  (TOVIAZ ) tablet 4 mg  4 mg Oral Daily Deward Eck, PA-C   4 mg at 05/23/24 1133   heparin  injection 5,000 Units  5,000 Units Subcutaneous Q8H Deward Eck, PA-C   5,000 Units at 05/23/24 1349   heparin  injection 5,000 Units  5,000 Units Subcutaneous Once Deward Eck, PA-C       hydrALAZINE  (APRESOLINE ) injection 10 mg  10 mg Intravenous Q4H PRN Deward Eck, PA-C       HYDROmorphone  (DILAUDID ) injection 1 mg  1 mg Intravenous Q4H PRN Deward Eck, PA-C   1 mg at 05/22/24 0518   levothyroxine  (SYNTHROID ) tablet 50 mcg  50 mcg Oral Q0600 Deward Eck, PA-C   50 mcg at 05/23/24 9475    menthol  (CEPACOL) lozenge 3 mg  1 lozenge Oral PRN Deward Eck, PA-C       Or   phenol (CHLORASEPTIC) mouth spray 1 spray  1 spray Mouth/Throat PRN Deward Eck, PA-C       methocarbamol  (ROBAXIN ) tablet 500 mg  500 mg Oral TID Deward Eck, PA-C   500 mg at 05/23/24 1612   metoCLOPramide  (REGLAN ) tablet 5-10 mg  5-10 mg Oral Q8H PRN Deward Eck, PA-C       Or   metoCLOPramide  (REGLAN ) injection 5-10 mg  5-10 mg Intravenous Q8H PRN Deward Eck, PA-C       metoprolol  succinate (TOPROL -XL) 24 hr tablet 75 mg  75 mg Oral Daily Deward Eck, PA-C   75 mg at 05/23/24 1005   ondansetron  (ZOFRAN ) injection 4 mg  4 mg Intravenous Once Deward Eck, PA-C       ondansetron  (ZOFRAN ) tablet 4 mg  4 mg Oral Q6H PRN Deward Eck, PA-C       Or   ondansetron  (ZOFRAN ) injection 4 mg  4 mg Intravenous Q6H PRN Deward Eck, PA-C       oxyCODONE  (Oxy IR/ROXICODONE ) immediate release tablet 5 mg  5 mg Oral Q4H PRN Deward Eck, PA-C   5 mg at 05/22/24 2050   pantoprazole  (PROTONIX ) EC tablet 40 mg  40 mg Oral Daily Deward Eck, PA-C   40 mg at 05/23/24 1005   phosphorus (K PHOS  NEUTRAL) tablet 250 mg  250 mg Oral BID Leotis Bogus, MD   250 mg at 05/23/24 1008   polyethylene glycol (MIRALAX  / GLYCOLAX ) packet 17 g  17 g Oral Daily PRN Deward Eck, PA-C       pravastatin  (PRAVACHOL ) tablet 20 mg  20 mg Oral Daily Deward Eck, PA-C   20 mg at 05/22/24 2050   sodium chloride  flush (NS) 0.9 % injection 3 mL  3 mL Intravenous Q12H Deward Eck, PA-C   3 mL at 05/23/24 1011   sodium chloride  flush (NS) 0.9 % injection 3 mL  3 mL Intravenous Q12H Deward Eck, PA-C   3 mL at 05/23/24 1011   sodium chloride  flush (NS) 0.9 % injection 3 mL  3 mL Intravenous PRN Deward Eck, PA-C       traZODone  (DESYREL ) tablet 50 mg  50 mg Oral QHS PRN Deward Eck, PA-C         Discharge Medications: Please see discharge summary for a list of discharge medications.  Relevant Imaging Results:  Relevant Lab Results:   Additional  Information SSN [id number]  Montie LOISE Louder, LCSW     "

## 2024-05-23 NOTE — TOC Progression Note (Signed)
 Transition of Care [facility]) - Progression Note    Patient Details  Name: Megan Mcmahon MRN: [id number] Date of Birth: 06-21-1950  [facility]) CM/SW Contact  Montie LOISE Louder, [place] Phone Number: 05/23/2024, 4:20 PM  Clinical Narrative:    CSW spoke with patient and spouse, Megan Mcmahon by phone- CSW introduced self and explained role. Patient agreeable to rehab at skilled nursing facility as recommended by PT. She and spouse preferred SNF is [facility]. CSW explained the SNF process. All questions answered.   TOC will provide bed offers once available.   Montie Louder, MSW, LCSW Clinical Social Worker       Barriers to Discharge: Continued Medical Work up               [facility]                                               Social Drivers of Health (SDOH) Interventions SDOH Screenings   Food Insecurity: No Food Insecurity (05/20/2024)  Housing: Low Risk (05/20/2024)  Transportation Needs: No Transportation Needs (05/20/2024)  Utilities: Not At Risk (05/20/2024)  Social Connections: Moderately Integrated (05/20/2024)  Tobacco Use: Low Risk (05/21/2024)    Readmission Risk Interventions     No data to display

## 2024-05-23 NOTE — Progress Notes (Signed)
 Physical Therapy Treatment Patient Details Name: Megan Mcmahon MRN: [id number] DOB: 08-01-1950 Today's Date: 05/23/2024   History of Present Illness Pt is a 74 y/o female presenting on 05/20/24 after fall.  Found with intertrochanteric fx of L femur. 1/13 S/P IM nail L hip, post op WBAT. PMH includes: CKD3, fibromyalgia, CAD, HTN, cataracts, baseline 4L O2 from COVID.    PT Comments  Pt shows good power during boost phase of standing trials in the RW, but during pre-gait activities is yet unable to fully w/shift L to unweight the R LE due to pain and is quick to fatigue once up on her feet in the RW.  Pt completed three standing trials with pre-gait each trial, but progressively more fatigued, SOF each trial.  Also worked on scooting asymmetrically and safety/placement of hands.  During trials.   Needs will be best met at <3 hr/day therapy.   If plan is discharge home, recommend the following: A lot of help with walking and/or transfers;A lot of help with bathing/dressing/bathroom;Help with stairs or ramp for entrance;Supervision due to cognitive status   Can travel by private vehicle     No  Equipment Recommendations  None recommended by PT;Other (comment) (TBA next venue)    Recommendations for Other Services       Precautions / Restrictions Precautions Precautions: Fall Restrictions LLE Weight Bearing Per Provider Order: Weight bearing as tolerated     Mobility  Bed Mobility               General bed mobility comments: OOB in the recliner on arrival    Transfers Overall transfer level: Needs assistance Equipment used: 1 person hand held assist (and husband stand by if needed) Transfers: Sit to/from Stand Sit to Stand: Max assist, +2 safety/equipment (x3 trials)           General transfer comment: pt shows good power during assist boosting, but immediately on w/bearing and standing becomes more painful around the surgical site and quickly becomes fatigued.     Ambulation/Gait             Pre-gait activities: Each of 3 trials standing worked on w/shifting with emphasis on w/s L to unweight the R LE.  Attempted to lift heel or take a step with R LE without success.  Pt stayed up on her feet less time with each successive trial.  25 secs max the first trial. General Gait Details: pt is unable to unweight R foot sufficiently enought to take a step or move her heel on the R foot   Stairs             Wheelchair Mobility     Tilt Bed    Modified Rankin (Stroke Patients Only)       Balance   Sitting-balance support: No upper extremity supported, Feet supported Sitting balance-Leahy Scale: Fair Sitting balance - Comments: As part of the STS trials, worked on w/shift and asymmetrical scooting with mod to max assist and use of the padding.   Standing balance support: Bilateral upper extremity supported, During functional activity Standing balance-Leahy Scale: Poor Standing balance comment: relies on BUE and external support                            Communication    Cognition Arousal: Alert Behavior During Therapy: Flat affect   PT - Cognitive impairments: No apparent impairments  Following commands: Impaired Following commands impaired: Follows one step commands with increased time    Cueing Cueing Techniques: Verbal cues  Exercises Other Exercises Other Exercises: LAQ bil x10  AA L LE, A R LE.    General Comments General comments (skin integrity, edema, etc.): HR in the 100 to low 110 range,  mild SOB and Sats when pleth good at low 90's %      Pertinent Vitals/Pain Pain Assessment Pain Assessment: Faces Faces Pain Scale: Hurts even more Pain Descriptors / Indicators: Discomfort, Grimacing, Operative site guarding Pain Intervention(s): Monitored during session, Limited activity within patient's tolerance    Home Living                          Prior  Function            PT Goals (current goals can now be found in the care plan section) Acute Rehab PT Goals PT Goal Formulation: With patient/family Time For Goal Achievement: 06/05/24 Potential to Achieve Goals: Good Progress towards PT goals: Progressing toward goals (slowly)    Frequency    Min 3X/week      PT Plan      Co-evaluation              AM-PAC PT 6 Clicks Mobility   Outcome Measure  Help needed turning from your back to your side while in a flat bed without using bedrails?: Total Help needed moving from lying on your back to sitting on the side of a flat bed without using bedrails?: Total Help needed moving to and from a bed to a chair (including a wheelchair)?: A Lot Help needed standing up from a chair using your arms (e.g., wheelchair or bedside chair)?: A Lot Help needed to walk in hospital room?: Total Help needed climbing 3-5 steps with a railing? : Total 6 Click Score: 8    End of Session Equipment Utilized During Treatment: Gait belt;Oxygen  Activity Tolerance: Patient limited by pain;Patient limited by lethargy;Patient limited by fatigue Patient left: in chair;with call bell/phone within reach;with chair alarm set;with family/visitor present Nurse Communication: Mobility status;Need for lift equipment PT Visit Diagnosis: Unsteadiness on feet (R26.81);Muscle weakness (generalized) (M62.81);Pain Pain - Right/Left: Left Pain - part of body: Leg     Time: 8845-8761 PT Time Calculation (min) (ACUTE ONLY): 44 min  Charges:    $Therapeutic Exercise: 8-22 mins $Therapeutic Activity: 23-37 mins PT General Charges $$ ACUTE PT VISIT: 1 Visit                     05/23/2024  Megan HERO., PT Acute Rehabilitation Services [phone]  (office)   Megan Mcmahon [place] Hantz 05/23/2024, 1:04 PM

## 2024-05-24 MED ORDER — OXYCODONE HCL 5 MG PO TABS: 5.0000 mg | ORAL_TABLET | Freq: Four times a day (QID) | ORAL | 0 refills | Status: AC | PRN

## 2024-05-24 MED ORDER — ENOXAPARIN SODIUM 40 MG/0.4ML IJ SOSY: 40.0000 mg | PREFILLED_SYRINGE | INTRAMUSCULAR | Status: AC

## 2024-05-24 NOTE — Progress Notes (Signed)
 "                                                                         Orthopaedic Trauma Service Progress Note  Patient ID: [place] MRN: [id number] DOB/AGE: Jun 26, 1950 74 y.o.  Subjective:  Ortho issues stable Worked with therapy yesterday   Very restricted mobility   ROS As above  Today's  total administered Morphine Milligram Equivalents: 0 Yesterday's total administered Morphine Milligram Equivalents: 7.5  Objective:   VITALS:   Vitals:   05/23/24 1932 05/23/24 2311 05/24/24 0413 05/24/24 0800  BP: 131/85 112/72 134/74 100/62  Pulse: (!) 101 (!) 101 100 94  Resp: 19 20 19 15   Temp: 98.8 F (37.1 C) 99 F (37.2 C) 99 F (37.2 C) 97.8 F (36.6 C)  TempSrc: Oral Oral Oral Tympanic  SpO2: 98% 96% 95% 98%  Weight:      Height:        Estimated body mass index is 43.28 kg/m as calculated from the following:   Height as of this encounter: 4' 9 (1.448 m).   Weight as of this encounter: 90.7 kg.   Intake/Output      01/15 0701 01/16 0700 01/16 0701 01/17 0700   P.O. 860    I.V. (mL/kg) 3 (0) 10 (0.1)   Total Intake(mL/kg) 863 (9.5) 10 (0.1)   Urine (mL/kg/hr) 670 (0.3)    Total Output 670    Net +193 +10          LABS  No results found for this or any previous visit (from the past 24 hours).   PHYSICAL EXAM:   Gen: resting comfortably in bed Ext:       Left Lower Extremity Dressings are clean, dry and intact with scant strikethrough to L thigh              Extremity is warm             No DCT             Compartments are soft             No pain out of proportion with passive stretching of toes or ankle             DPN, SPN, TN sensory functions are intact             EHL, FHL, lesser toe motor functions intact             Ankle flexion, extension, inversion eversion intact             + DP pulse    Assessment/Plan: 3 Days Post-Op   Principal Problem:   Femur fracture (HCC) Active Problems:   Chronic respiratory failure with  hypoxia and hypercapnia (HCC)   Morbid obesity due to excess calories (HCC)   Closed fracture dislocation of left hip joint (HCC)   Fall at home   Anti-infectives (From admission, onward)    Start     Dose/Rate Route Frequency Ordered Stop   05/21/24 1800  ceFAZolin  (ANCEF ) IVPB 2g/100 mL premix        2 g 200 mL/hr over 30 Minutes Intravenous Every 6 hours 05/21/24 1516 05/21/24 2340     .  POD/HD#: 80  74 y/o female s/p fall with L subtrochanteric femur fracture      - ground level fall    - L subtrochanteric femur fracture s/p IMN              WBAT L leg with assistance             Unrestricted ROM L hip and knee                   Therapy evals             Will need SNF             Ice PRN             Dressing changes as needed    - Pain management:             Multimodal             Minimize narcotics due to pulm issues   - ABL anemia/Hemodynamics             Stable, monitor   Cbc in am   - Medical issues              Per Primary    - DVT/PE prophylaxis:             On SQ heparin              Lovenox  x 30 days at dc  - ID:              Periop abx   - Metabolic Bone Disease:             Known osteoporosis             Fragility fracture             Vitamin d  levels look good             Will discuss different pharmacologic treatment at follow up. Will likely refer to osteoporosis clinic   - Activity:             As above   - Impediments to fracture healing:             Pulm disease             Thyroid  disease                       Osteoporosis              Fragility fracture   - Dispo:             Ortho issues stable             TOC consult              Therapies   Follow up with ortho in 2 weeks      Francis MICAEL Mt, PA-C [phone] (C) 05/24/2024, 10:55 AM  [facility] [address] [place] [address] [phone] [phone] (F)    After 5pm and on the weekends please log on to Amion, go to orthopaedics  and the look under the [facility] Call for the provider(s) on call. You can also call our office at [phone] and then follow the prompts to be connected to the call team.  Patient ID: [place], female   DOB: [birth year], 74 y.o.   MRN: [id number]  "

## 2024-05-24 NOTE — TOC Progression Note (Signed)
 Transition of Care [facility]) - Progression Note    Patient Details  Name: [place] MRN: [id number] Date of Birth: 1950-05-19  Transition of [facility]) CM/SW Contact  Luann SHAUNNA [place], [place] Phone Number: 05/24/2024, 2:12 PM  Clinical Narrative:     Met with pt and pt's spouse bedside. Provided SNF bed offers with medicare star ratings. Pt and spouse choose [facility]. CSW confirmed bed with [facility]. They can admit pt tomorrow.     Barriers to Discharge: Continued Medical Work up               Expected Discharge Plan and Services                                               Social Drivers of Health (SDOH) Interventions SDOH Screenings   Food Insecurity: No Food Insecurity (05/20/2024)  Housing: Low Risk (05/20/2024)  Transportation Needs: No Transportation Needs (05/20/2024)  Utilities: Not At Risk (05/20/2024)  Social Connections: Moderately Integrated (05/20/2024)  Tobacco Use: Low Risk (05/21/2024)    Readmission Risk Interventions     No data to display

## 2024-05-24 NOTE — Progress Notes (Signed)
 " PROGRESS NOTE    [place]  FMW:[id number] DOB: 02-Jul-1950 DOA: 05/20/2024 PCP: Trudy Vaughn FALCON, MD   Brief Narrative:  This 74 yrs old female presented in the ED status post mechanical fall with comminuted , displaced and impacted intertrochanteric fracture of left femur. Patient reports while going to take nighttime medicine,  Patient missed step and fell, did not hit her head, she was not dizzy and did not lose consciousness. Xray showed  Acute markedly comminuted, displaced, and impacted intertrochanteric fracture of the left proximal femur with medially displaced distal fracture fragment.  She was admitted for further evaluation orthopedics is consulted status post ORIF today POD #1.   Assessment & Plan:   Principal Problem:   Femur fracture (HCC) Active Problems:   Chronic respiratory failure with hypoxia and hypercapnia (HCC)   Morbid obesity due to excess calories (HCC)   Closed fracture dislocation of left hip joint (HCC)   Fall at home  Left hip fracture status post mechanical fall: Orthopedics consult appreciated. Patient is s/p ORIF 05/21/24 .  POD # 3 Continue As needed IV and oral opiates . WBAT left leg with assistance. PT and OT likely need SNF.   Essential HTN--stable, continue scheduled Toprol -XL May use IV hydralazine  as needed for elevated BP.   Chronic hypoxic respiratory failure: Patient is chronically on 2 L via nasal cannula at home.  Hypothyroidism: Continue Levothyroxine  50 mcg daily.   HLD: Continue Pravastatin .   Depression/Anxiety :  stable, Continue scheduled Cymbalta  and as needed Xanax .   GERD: Continue Protonix  40 daily.   Morbid Obesity- -Low calorie diet, portion control and increase physical activity discussed with patient after resolution of left hip issues -Body mass index is 43.28 kg/m.  Hypophosphatemia: Replaced.  Continue to monitor  DVT prophylaxis: Lovenox  Code Status:Full code Family Communication: Family at bed  side. Disposition Plan:  Status is: Inpatient Remains inpatient appropriate because: Admitted status post mechanical fall with left femur fracture,  orthopedics is consulted and underwent ORIF.  POD # 3.  PT and OT recommended SNF placement.  Patient is now medically ready for discharge.   Consultants:  Orthopaedics  Procedures: ORIF  Antimicrobials: Anti-infectives (From admission, onward)    Start     Dose/Rate Route Frequency Ordered Stop   05/21/24 1800  ceFAZolin  (ANCEF ) IVPB 2g/100 mL premix        2 g 200 mL/hr over 30 Minutes Intravenous Every 6 hours 05/21/24 1516 05/21/24 2340      Subjective: Patient was seen and examined at bedside. Overnight events noted. Patient is status post ORIF.  POD # 3.  She reports pain is reasonably controlled. She was sleeping in the morning.   Objective: Vitals:   05/23/24 1932 05/23/24 2311 05/24/24 0413 05/24/24 0800  BP: 131/85 112/72 134/74 100/62  Pulse: (!) 101 (!) 101 100 94  Resp: 19 20 19 15   Temp: 98.8 F (37.1 C) 99 F (37.2 C) 99 F (37.2 C) 97.8 F (36.6 C)  TempSrc: Oral Oral Oral Tympanic  SpO2: 98% 96% 95% 98%  Weight:      Height:        Intake/Output Summary (Last 24 hours) at 05/24/2024 1200 Last data filed at 05/24/2024 1019 Gross per 24 hour  Intake 633 ml  Output 670 ml  Net -37 ml   Filed Weights   05/20/24 0100 05/21/24 0902  Weight: 90.7 kg 90.7 kg    Examination:  General exam: Appears calm and comfortable, not in  any acute distress.  Respiratory system: CTA Bilaterally. Respiratory effort normal.  RR 13 Cardiovascular system: S1 & S2 heard, RRR. No JVD, murmurs, rubs, gallops or clicks. No pedal edema. Gastrointestinal system: Abdomen is non distended, soft and non tender. Normal bowel sounds heard. Central nervous system: Alert and oriented x 3. No focal neurological deficits. Extremities: S/P ORIF, left knee tenderness+ Skin: No rashes, lesions or ulcers Psychiatry: Judgement and  insight appear normal. Mood & affect appropriate.    Data Reviewed: I have personally reviewed following labs and imaging studies  CBC: Recent Labs  Lab 05/20/24 0215 05/21/24 0622 05/22/24 0545 05/23/24 0511  WBC 20.1* 9.9 12.1* 11.3*  NEUTROABS 17.5*  --   --   --   HGB 11.9* 9.3* 8.0* 7.9*  HCT 37.9 28.8* 24.5* 24.7*  MCV 91.3 88.9 88.8 90.8  PLT 197 150 148* 150   Basic Metabolic Panel: Recent Labs  Lab 05/20/24 0215 05/21/24 0622 05/22/24 0545 05/23/24 0511  NA 135 131* 134* 136  K 3.8 4.1 4.0 4.2  CL 90* 89* 93* 92*  CO2 33* 37* 34* 35*  GLUCOSE 115* 120* 131* 120*  BUN 10 11 8 9   CREATININE 0.73 0.73 0.75 0.65  CALCIUM 10.0 9.0 8.8* 9.1  MG  --   --   --  1.9  PHOS  --   --   --  2.0*   GFR: Estimated Creatinine Clearance: 60.5 mL/min (by C-G formula based on SCr of 0.65 mg/dL). Liver Function Tests: Recent Labs  Lab 05/21/24 0622  AST 23  ALT 10  ALKPHOS 51  BILITOT 0.5  PROT 5.8*  ALBUMIN  3.6   No results for input(s): LIPASE, AMYLASE in the last 168 hours. No results for input(s): AMMONIA in the last 168 hours. Coagulation Profile: Recent Labs  Lab 05/20/24 0215  INR 0.9   Cardiac Enzymes: No results for input(s): CKTOTAL, CKMB, CKMBINDEX, TROPONINI in the last 168 hours. BNP (last 3 results) No results for input(s): PROBNP in the last 8760 hours. HbA1C: No results for input(s): HGBA1C in the last 72 hours. CBG: No results for input(s): GLUCAP in the last 168 hours. Lipid Profile: No results for input(s): CHOL, HDL, LDLCALC, TRIG, CHOLHDL, LDLDIRECT in the last 72 hours. Thyroid  Function Tests: No results for input(s): TSH, T4TOTAL, FREET4, T3FREE, THYROIDAB in the last 72 hours. Anemia Panel: No results for input(s): VITAMINB12, FOLATE, FERRITIN, TIBC, IRON, RETICCTPCT in the last 72 hours. Sepsis Labs: No results for input(s): PROCALCITON, LATICACIDVEN in the last 168  hours.  Recent Results (from the past 240 hours)  Surgical pcr screen     Status: None   Collection Time: 05/21/24  6:11 AM   Specimen: Nasal Mucosa; Nasal Swab  Result Value Ref Range Status   MRSA, PCR NEGATIVE NEGATIVE Final   Staphylococcus aureus NEGATIVE NEGATIVE Final    Comment: (NOTE) The Xpert SA Assay (FDA approved for NASAL specimens in patients 9 years of age and older), is one component of a comprehensive surveillance program. It is not intended to diagnose infection nor to guide or monitor treatment. Performed at [facility], [address] [place], [place] [address]     Radiology Studies: No results found.   Scheduled Meds:  cholecalciferol   2,000 Units Oral BID   docusate sodium   100 mg Oral BID   DULoxetine   60 mg Oral Daily   feeding supplement  237 mL Oral BID BM   fesoterodine   4 mg Oral Daily  heparin  injection (subcutaneous)  5,000 Units Subcutaneous Q8H   heparin  injection (subcutaneous)  5,000 Units Subcutaneous Once   levothyroxine   50 mcg Oral Q0600   metoprolol  succinate  75 mg Oral Daily   ondansetron  (ZOFRAN ) IV  4 mg Intravenous Once   pantoprazole   40 mg Oral Daily   phosphorus  250 mg Oral BID   pravastatin   20 mg Oral Daily   sodium chloride  flush  3 mL Intravenous Q12H   sodium chloride  flush  3 mL Intravenous Q12H   Continuous Infusions:    LOS: 4 days    Time spent: 35 mins    Darcel Dawley, MD [facility]   If 7PM-7AM, please contact night-coverage  "

## 2024-05-24 NOTE — Plan of Care (Signed)
" °  Problem: Education: Goal: Knowledge of General Education information will improve Description: Including pain rating scale, medication(s)/side effects and non-pharmacologic comfort measures Outcome: Progressing   Problem: Health Behavior/Discharge Planning: Goal: Ability to manage health-related needs will improve Outcome: Progressing   Problem: Clinical Measurements: Goal: Ability to maintain clinical measurements within normal limits will improve Outcome: Progressing Goal: Will remain free from infection Outcome: Progressing Goal: Diagnostic test results will improve Outcome: Progressing Goal: Respiratory complications will improve Outcome: Progressing Goal: Cardiovascular complication will be avoided Outcome: Progressing   Problem: Activity: Goal: Risk for activity intolerance will decrease Outcome: Progressing   Problem: Nutrition: Goal: Adequate nutrition will be maintained Outcome: Progressing   Problem: Coping: Goal: Level of anxiety will decrease Outcome: Progressing   Problem: Elimination: Goal: Will not experience complications related to bowel motility Outcome: Progressing Goal: Will not experience complications related to urinary retention Outcome: Progressing   Problem: Pain Managment: Goal: General experience of comfort will improve and/or be controlled Outcome: Progressing   Problem: Safety: Goal: Ability to remain free from injury will improve Outcome: Progressing   Problem: Skin Integrity: Goal: Risk for impaired skin integrity will decrease Outcome: Progressing   Problem: Education: Goal: Knowledge of the prescribed therapeutic regimen will improve Outcome: Progressing   Problem: Bowel/Gastric: Goal: Gastrointestinal status for postoperative course will improve Outcome: Progressing   Problem: Cardiac: Goal: Ability to maintain an adequate cardiac output Outcome: Progressing Goal: Will show no evidence of cardiac arrhythmias Outcome:  Progressing   Problem: Nutritional: Goal: Will attain and maintain optimal nutritional status Outcome: Progressing   Problem: Neurological: Goal: Will regain or maintain usual level of consciousness Outcome: Progressing   Problem: Clinical Measurements: Goal: Ability to maintain clinical measurements within normal limits Outcome: Progressing Goal: Postoperative complications will be avoided or minimized Outcome: Progressing   Problem: Respiratory: Goal: Will regain and/or maintain adequate ventilation Outcome: Progressing Goal: Respiratory status will improve Outcome: Progressing   Problem: Skin Integrity: Goal: Demonstrates signs of wound healing without infection Outcome: Progressing   Problem: Urinary Elimination: Goal: Will remain free from infection Outcome: Progressing Goal: Ability to achieve and maintain adequate urine output Outcome: Progressing   Problem: Education: Goal: Verbalization of understanding the information provided (i.e., activity precautions, restrictions, etc) will improve Outcome: Progressing Goal: Individualized Educational Video(s) Outcome: Progressing   Problem: Activity: Goal: Ability to ambulate and perform ADLs will improve Outcome: Progressing   Problem: Clinical Measurements: Goal: Postoperative complications will be avoided or minimized Outcome: Progressing   Problem: Self-Concept: Goal: Ability to maintain and perform role responsibilities to the fullest extent possible will improve Outcome: Progressing   Problem: Pain Management: Goal: Pain level will decrease Outcome: Progressing   "

## 2024-05-25 ENCOUNTER — Inpatient Hospital Stay (HOSPITAL_COMMUNITY)

## 2024-05-25 DIAGNOSIS — S72352A Displaced comminuted fracture of shaft of left femur, initial encounter for closed fracture: Secondary | ICD-10-CM | POA: Diagnosis not present

## 2024-05-25 LAB — CBC
HCT: 24.7 % — ABNORMAL LOW (ref 36.0–46.0)
Hemoglobin: 8 g/dL — ABNORMAL LOW (ref 12.0–15.0)
MCH: 28.7 pg (ref 26.0–34.0)
MCHC: 32.4 g/dL (ref 30.0–36.0)
MCV: 88.5 fL (ref 80.0–100.0)
Platelets: 180 K/uL (ref 150–400)
RBC: 2.79 MIL/uL — ABNORMAL LOW (ref 3.87–5.11)
RDW: 13.7 % (ref 11.5–15.5)
WBC: 11.9 K/uL — ABNORMAL HIGH (ref 4.0–10.5)
nRBC: 0.7 % — ABNORMAL HIGH (ref 0.0–0.2)

## 2024-05-25 LAB — BASIC METABOLIC PANEL WITH GFR
Anion gap: 10 (ref 5–15)
BUN: 13 mg/dL (ref 8–23)
CO2: 31 mmol/L (ref 22–32)
Calcium: 8.6 mg/dL — ABNORMAL LOW (ref 8.9–10.3)
Chloride: 86 mmol/L — ABNORMAL LOW (ref 98–111)
Creatinine, Ser: 0.61 mg/dL (ref 0.44–1.00)
GFR, Estimated: >60 mL/min
Glucose, Bld: 98 mg/dL (ref 70–99)
Potassium: 4.2 mmol/L (ref 3.5–5.1)
Sodium: 127 mmol/L — ABNORMAL LOW (ref 135–145)

## 2024-05-25 MED ORDER — ALPRAZOLAM 0.5 MG PO TABS: 0.5000 mg | ORAL_TABLET | Freq: Three times a day (TID) | ORAL | 0 refills | Status: AC | PRN

## 2024-05-25 MED ORDER — ALPRAZOLAM 0.25 MG PO TABS
0.2500 mg | ORAL_TABLET | Freq: Once | ORAL | Status: AC
  Administered 2024-05-25: 0.25 mg via ORAL
  Filled 2024-05-25: qty 1

## 2024-05-25 MED ORDER — POLYETHYLENE GLYCOL 3350 17 G PO PACK
17.0000 g | PACK | Freq: Two times a day (BID) | ORAL | Status: AC
  Administered 2024-05-25 – 2024-05-26 (×2): 17 g via ORAL
  Filled 2024-05-25 (×4): qty 1

## 2024-05-25 MED ORDER — TRAMADOL HCL 50 MG PO TABS: 25.0000 mg | ORAL_TABLET | Freq: Two times a day (BID) | ORAL | 0 refills | Status: AC | PRN

## 2024-05-25 MED ORDER — BISACODYL 10 MG RE SUPP
10.0000 mg | Freq: Once | RECTAL | Status: AC
  Administered 2024-05-25: 10 mg via RECTAL
  Filled 2024-05-25: qty 1

## 2024-05-25 MED ORDER — POLYETHYLENE GLYCOL 3350 17 G PO PACK: 17.0000 g | PACK | Freq: Every day | ORAL | 0 refills | Status: AC

## 2024-05-25 MED ORDER — ENOXAPARIN SODIUM 40 MG/0.4ML IJ SOSY: 40.0000 mg | PREFILLED_SYRINGE | INTRAMUSCULAR | Status: AC

## 2024-05-25 MED ORDER — VITAMIN D3 25 MCG PO TABS: 2000.0000 [IU] | ORAL_TABLET | Freq: Two times a day (BID) | ORAL | Status: AC

## 2024-05-25 NOTE — Progress Notes (Signed)
 [facility] PROGRESS NOTE    Progress Note  [place]  FMW:[id number] DOB: February 27, 1951 DOA: 05/20/2024 PCP: Trudy Vaughn FALCON, MD     Brief Narrative:   Megan [place] is an 74 y.o. female past medical history of essential hypertension chronic kidney see stage IIIa brought into the ED for impacted intertrochanteric left femur fracture due to a mechanical fall.  Status post surgical intervention by orthopedic surgery on 05/23/2024   Assessment/Plan:   Left impacted femur fracture Status post ORIF on 05/21/2024. Continue narcotics. Started on a bowel regimen. PT evaluated the patient recommended skilled. Weightbearing as tolerated. DVT prophylaxis per orthopedic surgery recommended Lovenox  for 30 days  Chronic respiratory failure with hypoxia and hypercapnia : Continue 2 L of oxygen  which she is which she takes at home.  Essential hypertension: Continue metoprolol , can resume hydrochlorothiazide as an outpatient.  Hypothyroidism: Continue Synthroid   Hyperlipidemia: Sign continue statins.  Depression anxiety: Continue current regimen.  GERD: Continue Protonix .  Morbid obesity due to excess calories (HCC) Noted.    DVT prophylaxis: Heparin  Family Communication:husband Status is: Inpatient Remains inpatient appropriate because: Acute left hip fracture    Code Status:     Code Status Orders  (From admission, onward)           Start     Ordered   05/20/24 1008  Full code  Continuous       Question:  By:  Answer:  Consent: discussion documented in EHR   05/20/24 1008           Code Status History     Date Active Date Inactive Code Status Order ID Comments User Context   05/20/2024 0433 05/20/2024 1008 Full Code [id number]  Blondie Lynwood POUR, NP ED         IV Access:   Peripheral IV   Procedures and diagnostic studies:   No results found.   Medical Consultants:   None.   Subjective:    Megan [place] relates her  pain is not controlled  Objective:    Vitals:   05/24/24 1638 05/24/24 2020 05/24/24 2300 05/25/24 0326  BP: 112/83 129/69 (!) 152/88   Pulse: 83  90   Resp: (!) 21  (!) 29   Temp: 98.4 F (36.9 C) 98.4 F (36.9 C) 98.4 F (36.9 C)   TempSrc: Oral Oral Oral Oral  SpO2: 100%  100%   Weight:      Height:       SpO2: 100 % O2 Flow Rate (L/min): 4 L/min   Intake/Output Summary (Last 24 hours) at 05/25/2024 0705 Last data filed at 05/24/2024 1019 Gross per 24 hour  Intake 10 ml  Output --  Net 10 ml   Filed Weights   05/20/24 0100 05/21/24 0902  Weight: 90.7 kg 90.7 kg    Exam: General exam: In no acute distress. Respiratory system: Good air movement and clear to auscultation. Cardiovascular system: S1 & S2 heard, RRR. No JVD. Gastrointestinal system: Abdomen is nondistended, soft and nontender.  Extremities: Left lower extremity is significantly more swollen she relates she its  numb Skin: No rashes, lesions or ulcers Psychiatry: Judgement and insight appear normal. Mood & affect appropriate.    Data Reviewed:    Labs: Basic Metabolic Panel: Recent Labs  Lab 05/20/24 0215 05/21/24 0622 05/22/24 0545 05/23/24 0511 05/25/24 0240  NA 135 131* 134* 136 127*  K 3.8 4.1 4.0 4.2 4.2  CL 90* 89* 93* 92* 86*  CO2 33*  37* 34* 35* 31  GLUCOSE 115* 120* 131* 120* 98  BUN 10 11 8 9 13   CREATININE 0.73 0.73 0.75 0.65 0.61  CALCIUM 10.0 9.0 8.8* 9.1 8.6*  MG  --   --   --  1.9  --   PHOS  --   --   --  2.0*  --    GFR Estimated Creatinine Clearance: 60.5 mL/min (by C-G formula based on SCr of 0.61 mg/dL). Liver Function Tests: Recent Labs  Lab 05/21/24 0622  AST 23  ALT 10  ALKPHOS 51  BILITOT 0.5  PROT 5.8*  ALBUMIN  3.6   No results for input(s): LIPASE, AMYLASE in the last 168 hours. No results for input(s): AMMONIA in the last 168 hours. Coagulation profile Recent Labs  Lab 05/20/24 0215  INR 0.9   COVID-19 Labs  No results for input(s):  DDIMER, FERRITIN, LDH, CRP in the last 72 hours.  Lab Results  Component Value Date   SARSCOV2NAA NEGATIVE 08/04/2020    CBC: Recent Labs  Lab 05/20/24 0215 05/21/24 0622 05/22/24 0545 05/23/24 0511 05/25/24 0240  WBC 20.1* 9.9 12.1* 11.3* 11.9*  NEUTROABS 17.5*  --   --   --   --   HGB 11.9* 9.3* 8.0* 7.9* 8.0*  HCT 37.9 28.8* 24.5* 24.7* 24.7*  MCV 91.3 88.9 88.8 90.8 88.5  PLT 197 150 148* 150 180   Cardiac Enzymes: No results for input(s): CKTOTAL, CKMB, CKMBINDEX, TROPONINI in the last 168 hours. BNP (last 3 results) No results for input(s): PROBNP in the last 8760 hours. CBG: No results for input(s): GLUCAP in the last 168 hours. D-Dimer: No results for input(s): DDIMER in the last 72 hours. Hgb A1c: No results for input(s): HGBA1C in the last 72 hours. Lipid Profile: No results for input(s): CHOL, HDL, LDLCALC, TRIG, CHOLHDL, LDLDIRECT in the last 72 hours. Thyroid  function studies: No results for input(s): TSH, T4TOTAL, T3FREE, THYROIDAB in the last 72 hours.  Invalid input(s): FREET3 Anemia work up: No results for input(s): VITAMINB12, FOLATE, FERRITIN, TIBC, IRON, RETICCTPCT in the last 72 hours. Sepsis Labs: Recent Labs  Lab 05/21/24 0622 05/22/24 0545 05/23/24 0511 05/25/24 0240  WBC 9.9 12.1* 11.3* 11.9*   Microbiology Recent Results (from the past 240 hours)  Surgical pcr screen     Status: None   Collection Time: 05/21/24  6:11 AM   Specimen: Nasal Mucosa; Nasal Swab  Result Value Ref Range Status   MRSA, PCR NEGATIVE NEGATIVE Final   Staphylococcus aureus NEGATIVE NEGATIVE Final    Comment: (NOTE) The Xpert SA Assay (FDA approved for NASAL specimens in patients 39 years of age and older), is one component of a comprehensive surveillance program. It is not intended to diagnose infection nor to guide or monitor treatment. Performed at [facility], [address]  [place], [place] [address]      Medications:    cholecalciferol   2,000 Units Oral BID   docusate sodium   100 mg Oral BID   DULoxetine   60 mg Oral Daily   feeding supplement  237 mL Oral BID BM   fesoterodine   4 mg Oral Daily   heparin  injection (subcutaneous)  5,000 Units Subcutaneous Q8H   heparin  injection (subcutaneous)  5,000 Units Subcutaneous Once   levothyroxine   50 mcg Oral Q0600   metoprolol  succinate  75 mg Oral Daily   ondansetron  (ZOFRAN ) IV  4 mg Intravenous Once   pantoprazole   40 mg Oral Daily   phosphorus  250 mg Oral BID   pravastatin   20 mg Oral Daily   sodium chloride  flush  3 mL Intravenous Q12H   sodium chloride  flush  3 mL Intravenous Q12H   Continuous Infusions:    LOS: 5 days   Erle Odell Castor  [facility]  05/25/2024, 7:05 AM

## 2024-05-26 ENCOUNTER — Inpatient Hospital Stay (HOSPITAL_COMMUNITY)

## 2024-05-26 DIAGNOSIS — M7989 Other specified soft tissue disorders: Secondary | ICD-10-CM | POA: Diagnosis not present

## 2024-05-26 DIAGNOSIS — S72352A Displaced comminuted fracture of shaft of left femur, initial encounter for closed fracture: Secondary | ICD-10-CM | POA: Diagnosis not present

## 2024-05-26 MED ORDER — ENOXAPARIN SODIUM 40 MG/0.4ML IJ SOSY
40.0000 mg | PREFILLED_SYRINGE | Freq: Every day | INTRAMUSCULAR | Status: DC
  Administered 2024-05-26 – 2024-05-27 (×2): 40 mg via SUBCUTANEOUS
  Filled 2024-05-26 (×2): qty 0.4

## 2024-05-26 MED ORDER — ALPRAZOLAM 0.25 MG PO TABS
0.2500 mg | ORAL_TABLET | Freq: Once | ORAL | Status: AC
  Administered 2024-05-26: 0.25 mg via ORAL
  Filled 2024-05-26: qty 1

## 2024-05-26 NOTE — Progress Notes (Addendum)
 PHARMACY - ANTICOAGULATION CONSULT NOTE  Pharmacy Consult for enoxaparin  Indication: VTE prophylaxis  Allergies[1]  Patient Measurements: Height: 4' 9 (144.8 cm) Weight: 90.7 kg (200 lb) IBW/kg (Calculated) : 38.6 HEPARIN  DW (KG): 61  Vital Signs: Temp: 98.7 F (37.1 C) (01/18 0839) Temp Source: Oral (01/18 0839) BP: 138/74 (01/18 0839) Pulse Rate: 79 (01/18 0839)  Labs: Recent Labs    05/25/24 0240  HGB 8.0*  HCT 24.7*  PLT 180  CREATININE 0.61    Estimated Creatinine Clearance: 60.5 mL/min (by C-G formula based on SCr of 0.61 mg/dL).   Medical History: Past Medical History:  Diagnosis Date   Chronic kidney disease, stage 3 (HCC)    COVID-19 long hauler manifesting chronic dyspnea    Continous oxygen  therapy   Fibromyalgia    GAD (generalized anxiety disorder)    GERD (gastroesophageal reflux disease)    Hyperlipidemia    Hypertension    Hypothyroidism       Assessment: 74 yo W on heparin  subcutaneous for VTE prophylaxis. Pharmacy consulted for enoxaparin  VTE prophylaxis.  Received heparin  at 05:45. ClCr 60 ml/min. Doppler pending to r/o DVT.  Goal of Therapy:  Monitor platelets by anticoagulation protocol: Yes   Plan:   Stop heaprin  Start enoxaparin  40 mg daily F/u dopplers  Jinnie Door, PharmD, BCPS, BCCP Clinical Pharmacist  Please check AMION for all [facility] Pharmacy phone numbers After 10:00 PM, call Main Pharmacy [phone]     [1]  Allergies Allergen Reactions   Other Rash    Catgut sutures   Wound Dressing Adhesive Rash   Codeine     vomiting

## 2024-05-26 NOTE — Progress Notes (Signed)
 [facility] PROGRESS NOTE    Progress Note  Megan Mcmahon  FMW:[id number] DOB: 1951-01-25 DOA: 05/20/2024 PCP: Trudy Vaughn FALCON, MD     Brief Narrative:   Megan Mcmahon is an 74 y.o. female past medical history of essential hypertension chronic kidney see stage IIIa brought into the ED for impacted intertrochanteric left femur fracture due to a mechanical fall.  Status post surgical intervention by orthopedic surgery on 05/23/2024   Assessment/Plan:   Left impacted femur fracture Status post ORIF on 05/21/2024. Continue narcotics. Continue bowel regimen PT evaluated the patient recommended skilled. Weightbearing as tolerated. DVT prophylaxis per orthopedic surgery recommended Lovenox  for 30 days. Left lower extremity swelling check lower extremity Doppler to rule out DVT  Chronic respiratory failure with hypoxia and hypercapnia : Continue 2 L of oxygen  which she is which she takes at home.  Essential hypertension: Continue metoprolol , can resume hydrochlorothiazide as an outpatient.  Hypothyroidism: Continue Synthroid   Hyperlipidemia:  Continue statins.  Depression anxiety: Continue current regimen.  GERD: Continue Protonix .  Morbid obesity due to excess calories (HCC) Noted.    DVT prophylaxis: Heparin  Family Communication:husband Status is: Inpatient Remains inpatient appropriate because: Acute left hip fracture    Code Status:     Code Status Orders  (From admission, onward)           Start     Ordered   05/20/24 1008  Full code  Continuous       Question:  By:  Answer:  Consent: discussion documented in EHR   05/20/24 1008           Code Status History     Date Active Date Inactive Code Status Order ID Comments User Context   05/20/2024 0433 05/20/2024 1008 Full Code [id number]  Blondie Lynwood POUR, NP ED         IV Access:   Peripheral IV   Procedures and diagnostic studies:   No results found.   Medical  Consultants:   None.   Subjective:    Megan Mcmahon pain is controlled had a bowel movement.  Objective:    Vitals:   05/25/24 1611 05/25/24 1942 05/25/24 2257 05/26/24 0316  BP: 130/65 131/70 136/77 (!) 122/57  Pulse: 73 72 71 77  Resp: 18 19 (!) 23 20  Temp: 97.9 F (36.6 C) 97.9 F (36.6 C) 99.1 F (37.3 C) 98.6 F (37 C)  TempSrc: Oral Oral Oral Axillary  SpO2: 100% 100% 93% 94%  Weight:      Height:       SpO2: 94 % O2 Flow Rate (L/min): 3 L/min   Intake/Output Summary (Last 24 hours) at 05/26/2024 0736 Last data filed at 05/26/2024 0700 Gross per 24 hour  Intake 250 ml  Output --  Net 250 ml   Filed Weights   05/20/24 0100 05/21/24 0902  Weight: 90.7 kg 90.7 kg    Exam: General exam: In no acute distress. Respiratory system: Good air movement and clear to auscultation. Cardiovascular system: S1 & S2 heard, RRR. No JVD. Gastrointestinal system: Abdomen is nondistended, soft and nontender.  Extremities: No pedal edema. Skin: No rashes, lesions or ulcers Psychiatry: Judgement and insight appear normal. Mood & affect appropriate.  Data Reviewed:    Labs: Basic Metabolic Panel: Recent Labs  Lab 05/20/24 0215 05/21/24 0622 05/22/24 0545 05/23/24 0511 05/25/24 0240  NA 135 131* 134* 136 127*  K 3.8 4.1 4.0 4.2 4.2  CL 90* 89* 93* 92* 86*  CO2  33* 37* 34* 35* 31  GLUCOSE 115* 120* 131* 120* 98  BUN 10 11 8 9 13   CREATININE 0.73 0.73 0.75 0.65 0.61  CALCIUM 10.0 9.0 8.8* 9.1 8.6*  MG  --   --   --  1.9  --   PHOS  --   --   --  2.0*  --    GFR Estimated Creatinine Clearance: 60.5 mL/min (by C-G formula based on SCr of 0.61 mg/dL). Liver Function Tests: Recent Labs  Lab 05/21/24 0622  AST 23  ALT 10  ALKPHOS 51  BILITOT 0.5  PROT 5.8*  ALBUMIN  3.6   No results for input(s): LIPASE, AMYLASE in the last 168 hours. No results for input(s): AMMONIA in the last 168 hours. Coagulation profile Recent Labs  Lab 05/20/24 0215   INR 0.9   COVID-19 Labs  No results for input(s): DDIMER, FERRITIN, LDH, CRP in the last 72 hours.  Lab Results  Component Value Date   SARSCOV2NAA NEGATIVE 08/04/2020    CBC: Recent Labs  Lab 05/20/24 0215 05/21/24 0622 05/22/24 0545 05/23/24 0511 05/25/24 0240  WBC 20.1* 9.9 12.1* 11.3* 11.9*  NEUTROABS 17.5*  --   --   --   --   HGB 11.9* 9.3* 8.0* 7.9* 8.0*  HCT 37.9 28.8* 24.5* 24.7* 24.7*  MCV 91.3 88.9 88.8 90.8 88.5  PLT 197 150 148* 150 180   Cardiac Enzymes: No results for input(s): CKTOTAL, CKMB, CKMBINDEX, TROPONINI in the last 168 hours. BNP (last 3 results) No results for input(s): PROBNP in the last 8760 hours. CBG: No results for input(s): GLUCAP in the last 168 hours. D-Dimer: No results for input(s): DDIMER in the last 72 hours. Hgb A1c: No results for input(s): HGBA1C in the last 72 hours. Lipid Profile: No results for input(s): CHOL, HDL, LDLCALC, TRIG, CHOLHDL, LDLDIRECT in the last 72 hours. Thyroid  function studies: No results for input(s): TSH, T4TOTAL, T3FREE, THYROIDAB in the last 72 hours.  Invalid input(s): FREET3 Anemia work up: No results for input(s): VITAMINB12, FOLATE, FERRITIN, TIBC, IRON, RETICCTPCT in the last 72 hours. Sepsis Labs: Recent Labs  Lab 05/21/24 0622 05/22/24 0545 05/23/24 0511 05/25/24 0240  WBC 9.9 12.1* 11.3* 11.9*   Microbiology Recent Results (from the past 240 hours)  Surgical pcr screen     Status: None   Collection Time: 05/21/24  6:11 AM   Specimen: Nasal Mucosa; Nasal Swab  Result Value Ref Range Status   MRSA, PCR NEGATIVE NEGATIVE Final   Staphylococcus aureus NEGATIVE NEGATIVE Final    Comment: (NOTE) The Xpert SA Assay (FDA approved for NASAL specimens in patients 60 years of age and older), is one component of a comprehensive surveillance program. It is not intended to diagnose infection nor to guide or monitor  treatment. Performed at [facility], [address] [place], [place] [address]      Medications:    cholecalciferol   2,000 Units Oral BID   docusate sodium   100 mg Oral BID   DULoxetine   60 mg Oral Daily   feeding supplement  237 mL Oral BID BM   fesoterodine   4 mg Oral Daily   heparin  injection (subcutaneous)  5,000 Units Subcutaneous Q8H   heparin  injection (subcutaneous)  5,000 Units Subcutaneous Once   levothyroxine   50 mcg Oral Q0600   metoprolol  succinate  75 mg Oral Daily   ondansetron  (ZOFRAN ) IV  4 mg Intravenous Once   pantoprazole   40 mg Oral Daily   polyethylene  glycol  17 g Oral BID   pravastatin   20 mg Oral Daily   sodium chloride  flush  3 mL Intravenous Q12H   sodium chloride  flush  3 mL Intravenous Q12H   Continuous Infusions:    LOS: 6 days   Erle Odell Castor  [facility]  05/26/2024, 7:36 AM

## 2024-05-26 NOTE — Progress Notes (Signed)
 VASCULAR LAB    Bilateral lower extremity venous duplex has been performed.  See CV proc for preliminary results.   Aadon Gorelik, RVT 05/26/2024, 10:44 AM

## 2024-05-27 DIAGNOSIS — S72002A Fracture of unspecified part of neck of left femur, initial encounter for closed fracture: Secondary | ICD-10-CM | POA: Diagnosis not present

## 2024-05-27 DIAGNOSIS — S72352A Displaced comminuted fracture of shaft of left femur, initial encounter for closed fracture: Secondary | ICD-10-CM | POA: Diagnosis not present

## 2024-05-27 NOTE — TOC Transition Note (Signed)
 Transition of Care [facility]) - Discharge Note   Patient Details  Name: Megan Mcmahon MRN: [id number] Date of Birth: [birth year]  Transition of Care [facility]) CM/SW Contact:  Sherline Clack, LCSWA Phone Number: 05/27/2024, 10:05 AM   Clinical Narrative:     Patient will DC to: [facility] Anticipated DC date: 05/27/24  Family notified: husband/John Transport by: [facility]   Per MD patient ready for DC to [facility] RN to call report prior to discharge [phone], [phone] ). RN, patient, patient's family, and facility notified of DC. Discharge Summary and FL2 sent to facility. DC packet on chart. Ambulance transport requested for patient.   CSW will sign off for now as social work intervention is no longer needed. Please consult us  again if new needs arise.    Final next level of care: Skilled Nursing Facility Barriers to Discharge: Barriers Resolved   Patient Goals and CMS Choice            Discharge Placement              Patient chooses bed at: [facility] Patient to be transferred to facility by: [facility] Name of family member notified: John/husband Patient and family notified of of transfer: 05/27/24  Discharge Plan and Services Additional resources added to the After Visit Summary for                                       Social Drivers of Health (SDOH) Interventions SDOH Screenings   Food Insecurity: No Food Insecurity (05/20/2024)  Housing: Low Risk (05/20/2024)  Transportation Needs: No Transportation Needs (05/20/2024)  Utilities: Not At Risk (05/20/2024)  Social Connections: Moderately Integrated (05/20/2024)  Tobacco Use: Low Risk (05/21/2024)     Readmission Risk Interventions     No data to display

## 2024-05-27 NOTE — Plan of Care (Signed)
?  Problem: Health Behavior/Discharge Planning: ?Goal: Ability to manage health-related needs will improve ?Outcome: Progressing ?  ?Problem: Activity: ?Goal: Risk for activity intolerance will decrease ?Outcome: Progressing ?  ?Problem: Elimination: ?Goal: Will not experience complications related to bowel motility ?Outcome: Progressing ?  ?

## 2024-05-27 NOTE — Discharge Summary (Signed)
 Physician Discharge Summary  Megan Mcmahon FMW:[id number] DOB: Apr 10, 1951 DOA: 05/20/2024  PCP: Trudy Vaughn FALCON, MD  Admit date: 05/20/2024 Discharge date: 05/27/2024  Admitted From: Home Disposition:  SNF  Recommendations for Outpatient Follow-up:  Follow up with PCP in 1-2 weeks Please obtain BMP/CBC in one week   Home Health:No Equipment/Devices:None  Discharge Condition:Stable CODE STATUS:Full Diet recommendation: Heart Healthy  Brief/Interim Summary: 74 y.o. female past medical history of essential hypertension chronic kidney see stage IIIa brought into the ED for impacted intertrochanteric left femur fracture due to a mechanical fall.  Status post surgical intervention by orthopedic surgery on 05/23/2024   Discharge Diagnoses:  Principal Problem:   Femur fracture [facility]) Active Problems:   Chronic respiratory failure with hypoxia and hypercapnia (HCC)   Morbid obesity due to excess calories (HCC)   Closed fracture dislocation of left hip joint (HCC)   Fall at home  Left hip active femur fracture: Orthopedic surgery was consulted and status post ORIF on 05/21/2024. Narcotics and anticoagulation per orthopedic surgery. They recommended Lovenox  for 30 days. PT evaluated the patient will need home health PT.  Chronic respiratory failure with hypoxia and hypercapnia : Continue 2 L of oxygen  which she is which she takes at home.   Essential hypertension: Continue metoprolol , can resume hydrochlorothiazide as an outpatient.   Hypothyroidism: Continue Synthroid    Hyperlipidemia: Sign continue statins.   Depression anxiety: Continue current regimen.   GERD: Continue Protonix .   Morbid obesity due to excess calories (HCC) Noted.  Discharge Instructions  Discharge Instructions     Increase activity slowly   Complete by: As directed    No wound care   Complete by: As directed       Allergies as of 05/27/2024       Reactions   Other Rash   Catgut  sutures   Wound Dressing Adhesive Rash   Codeine    vomiting        Medication List     TAKE these medications    alendronate 70 MG tablet Commonly known as: FOSAMAX Take 70 mg by mouth once a week.   ALPRAZolam  0.5 MG tablet Commonly known as: XANAX  Take 1 tablet (0.5 mg total) by mouth 3 (three) times daily as needed for anxiety.   amitriptyline 25 MG tablet Commonly known as: ELAVIL Take 25 mg by mouth at bedtime. Take one quarter tablet at bedtime.   DULoxetine  60 MG capsule Commonly known as: CYMBALTA  Take 60 mg by mouth daily.   enoxaparin  40 MG/0.4ML injection Commonly known as: LOVENOX  Inject 0.4 mLs (40 mg total) into the skin daily.   enoxaparin  40 MG/0.4ML injection Commonly known as: LOVENOX  Inject 0.4 mLs (40 mg total) into the skin daily.   famotidine  20 MG tablet Commonly known as: PEPCID  TAKE 1 TABLET DAILY AFTER SUPPER   fluticasone 50 MCG/ACT nasal spray Commonly known as: FLONASE Place 1 spray into both nostrils daily.   hydrochlorothiazide 25 MG tablet Commonly known as: HYDRODIURIL Take 25 mg by mouth daily.   levothyroxine  50 MCG tablet Commonly known as: SYNTHROID  Take 50 mcg by mouth daily before breakfast.   metoprolol  succinate 50 MG 24 hr tablet Commonly known as: TOPROL -XL Take 75 mg by mouth daily. Take with or immediately following a meal.   oxyCODONE  5 MG immediate release tablet Commonly known as: Oxy IR/ROXICODONE  Take 1 tablet (5 mg total) by mouth every 6 (six) hours as needed for moderate pain (pain score 4-6).   polyethylene glycol  17 g packet Commonly known as: MIRALAX  / GLYCOLAX  Take 17 g by mouth daily.   pravastatin  20 MG tablet Commonly known as: PRAVACHOL  Take 20 mg by mouth daily.   pregabalin 75 MG capsule Commonly known as: LYRICA Take 75 mg by mouth 2 (two) times daily.   tolterodine 4 MG 24 hr capsule Commonly known as: DETROL LA Take 4 mg by mouth daily.   traMADol  50 MG tablet Commonly  known as: ULTRAM  Take 0.5-1 tablets (25-50 mg total) by mouth every 12 (twelve) hours as needed for moderate pain (pain score 4-6). 1/2 tab 2-3X day   vitamin D3 25 MCG tablet Commonly known as: CHOLECALCIFEROL  Take 2 tablets (2,000 Units total) by mouth 2 (two) times daily.        Contact information for follow-up providers     Celena Sharper, MD. Schedule an appointment as soon as possible for a visit in 2 week(s).   Specialty: Orthopedic Surgery Contact information: [address] [place] [address] [phone]              Contact information for after-discharge care     Destination     [facility] .   Service: Skilled Nursing Contact information: [address] [facility] [place]  [address] [phone]                    Allergies[1]  Consultations: Orthopedic surgery   Procedures/Studies: VAS US  LOWER EXTREMITY VENOUS (DVT) Result Date: 05/26/2024  Lower Venous DVT Study Patient Name:  Megan Mcmahon  Date of Exam:   05/26/2024 Medical Rec #: [id number]         Accession #:    [id number] Date of Birth: 05-Jan-1951         Patient Gender: F Patient Age:   74 years Exam Location:  [facility] Procedure:      VAS US  LOWER EXTREMITY VENOUS (DVT) Referring Phys: ERLE CLUNES ORTIZ --------------------------------------------------------------------------------  Indications: Pain, and Swelling, left > right. Other Indications: Status post fall 05/20/24 resulting in left Severely                    Comminuted reverse Obliquity four part intertrochanteric hip                    fracture. Intramedullary nailing of left hip 05/21/24 Limitations: Body habitus, poor ultrasound/tissue interface and Edema, inability to adequately position left leg. Comparison Study: No prior study on file Performing Technologist: Alberta Lis RVS  Examination Guidelines: A complete evaluation includes B-mode imaging, spectral Doppler, color Doppler, and power  Doppler as needed of all accessible portions of each vessel. Bilateral testing is considered an integral part of a complete examination. Limited examinations for reoccurring indications may be performed as noted. The reflux portion of the exam is performed with the patient in reverse Trendelenburg.  +---------+---------------+---------+-----------+----------+--------------+ RIGHT    CompressibilityPhasicitySpontaneityPropertiesThrombus Aging +---------+---------------+---------+-----------+----------+--------------+ CFV      Full           Yes      Yes                                 +---------+---------------+---------+-----------+----------+--------------+ SFJ      Full                                                        +---------+---------------+---------+-----------+----------+--------------+  FV Prox  Full                                                        +---------+---------------+---------+-----------+----------+--------------+ FV Mid   Full                                                        +---------+---------------+---------+-----------+----------+--------------+ FV DistalFull           Yes      Yes                                 +---------+---------------+---------+-----------+----------+--------------+ PFV      Full                                                        +---------+---------------+---------+-----------+----------+--------------+ POP      Full           Yes      Yes                                 +---------+---------------+---------+-----------+----------+--------------+ PTV      Full                                                        +---------+---------------+---------+-----------+----------+--------------+ PERO     Full                                                        +---------+---------------+---------+-----------+----------+--------------+    +---------+---------------+---------+-----------+----------+-------------------+ LEFT     CompressibilityPhasicitySpontaneityPropertiesThrombus Aging      +---------+---------------+---------+-----------+----------+-------------------+ CFV      Full           Yes      Yes                                      +---------+---------------+---------+-----------+----------+-------------------+ SFJ      Full                                                             +---------+---------------+---------+-----------+----------+-------------------+ FV Prox  Full           Yes      Yes                                      +---------+---------------+---------+-----------+----------+-------------------+  FV Mid   Full                                                             +---------+---------------+---------+-----------+----------+-------------------+ FV DistalFull           Yes      Yes                                      +---------+---------------+---------+-----------+----------+-------------------+ PFV      Full           Yes      Yes                                      +---------+---------------+---------+-----------+----------+-------------------+ POP                     Yes      Yes                  patent by color and                                                       Doppler             +---------+---------------+---------+-----------+----------+-------------------+ PTV      Full                                                             +---------+---------------+---------+-----------+----------+-------------------+ PERO                                                  Not well visualized +---------+---------------+---------+-----------+----------+-------------------+     Summary: RIGHT: - There is no evidence of deep vein thrombosis in the lower extremity.  LEFT: - There is no evidence of deep vein thrombosis in the lower  extremity.  *See table(s) above for measurements and observations. Electronically signed by Gaile New MD on 05/26/2024 at 4:59:45 PM.    Final    DG FEMUR PORT MIN 2 VIEWS LEFT Result Date: 05/21/2024 CLINICAL DATA:  Postoperative left femoral fracture. EXAM: LEFT FEMUR PORTABLE 2 VIEWS COMPARISON:  Left hip radiograph dated 05/20/2024. FINDINGS: Status post ORIF of comminuted left femoral fracture. The hardware appears intact. No dislocation. The soft tissues are unremarkable. IMPRESSION: Status post ORIF of comminuted left femoral fracture. Electronically Signed   By: Vanetta Chou M.D.   On: 05/21/2024 16:57   DG FEMUR MIN 2 VIEWS LEFT Result Date: 05/21/2024 CLINICAL DATA:  Left femoral neck fracture.  Surgery. EXAM: LEFT FEMUR 2 VIEWS COMPARISON:  Radiograph dated 05/20/2024. FINDINGS: Intraoperative fluoroscopic spot images of the left hip provided. The total  fluoroscopic time is 88.5 seconds with a cumulative air kerma of 25.27 mGy. Status post ORIF of the left femoral neck fracture. IMPRESSION: Status post ORIF of the left femoral neck fracture. Electronically Signed   By: Vanetta Chou M.D.   On: 05/21/2024 16:47   DG C-Arm 1-60 Min-No Report Result Date: 05/21/2024 Fluoroscopy was utilized by the requesting physician.  No radiographic interpretation.   DG C-Arm 1-60 Min-No Report Result Date: 05/21/2024 Fluoroscopy was utilized by the requesting physician.  No radiographic interpretation.   DG Chest Port 1 View Result Date: 05/20/2024 EXAM: 1 VIEW(S) XRAY OF THE CHEST 05/20/2024 01:50:00 AM COMPARISON: 12/19/2023 CLINICAL HISTORY: hip fx, preop FINDINGS: LUNGS AND PLEURA: Elevated right hemidiaphragm. Subsegmental atelectasis or scarring at left lung base. No pleural effusion. No pneumothorax. HEART AND MEDIASTINUM: No acute abnormality of the cardiac and mediastinal silhouettes. BONES AND SOFT TISSUES: No acute osseous abnormality. IMPRESSION: 1. No acute cardiopulmonary  abnormality. 2. Elevated right hemidiaphragm with subsegmental atelectasis at the left lung base. Electronically signed by: Morgane Naveau MD MD 05/20/2024 02:02 AM EST RP Workstation: [id number]   DG Hip Unilat With Pelvis 2-3 Views Left Result Date: 05/20/2024 EXAM: 2 OR MORE VIEW(S) XRAY OF THE LEFT HIP 05/20/2024 01:50:00 AM COMPARISON: None available. CLINICAL HISTORY: hip fx, preop FINDINGS: BONES AND JOINTS: Acute markedly comminuted, displaced, and impacted intertrochanteric fracture of the left proximal femur. Medially displaced distal fracture fragment. SOFT TISSUES: Unremarkable. IMPRESSION: 1. Acute markedly comminuted, displaced, and impacted intertrochanteric fracture of the left proximal femur with medially displaced distal fracture fragment. Electronically signed by: Kate Plummer MD MD 05/20/2024 02:02 AM EST RP Workstation: [id number]     Subjective: No complaints  Discharge Exam: Vitals:   05/26/24 2258 05/27/24 0240  BP: 133/88 (!) 120/58  Pulse: 84 80  Resp: 20 18  Temp: 98.3 F (36.8 C) 98.3 F (36.8 C)  SpO2: 93% 97%   Vitals:   05/26/24 1553 05/26/24 1946 05/26/24 2258 05/27/24 0240  BP: 130/76 115/72 133/88 (!) 120/58  Pulse: 72 75 84 80  Resp: (!) 23 20 20 18   Temp: 98.6 F (37 C) 98.6 F (37 C) 98.3 F (36.8 C) 98.3 F (36.8 C)  TempSrc: Oral Oral Oral Oral  SpO2: 99% 98% 93% 97%  Weight:      Height:        General: Pt is alert, awake, not in acute distress Cardiovascular: RRR, S1/S2 +, no rubs, no gallops Respiratory: CTA bilaterally, no wheezing, no rhonchi Abdominal: Soft, NT, ND, bowel sounds + Extremities: no edema, no cyanosis    The results of significant diagnostics from this hospitalization (including imaging, microbiology, ancillary and laboratory) are listed below for reference.     Microbiology: Recent Results (from the past 240 hours)  Surgical pcr screen     Status: None   Collection Time: 05/21/24  6:11 AM   Specimen:  Nasal Mucosa; Nasal Swab  Result Value Ref Range Status   MRSA, PCR NEGATIVE NEGATIVE Final   Staphylococcus aureus NEGATIVE NEGATIVE Final    Comment: (NOTE) The Xpert SA Assay (FDA approved for NASAL specimens in patients 37 years of age and older), is one component of a comprehensive surveillance program. It is not intended to diagnose infection nor to guide or monitor treatment. Performed at [facility], [address], [place], [place] [address]      Labs: BNP (last 3 results) No results for input(s): BNP in the last 8760 hours. Basic Metabolic Panel: Recent  Labs  Lab 05/21/24 0622 05/22/24 0545 05/23/24 0511 05/25/24 0240  NA 131* 134* 136 127*  K 4.1 4.0 4.2 4.2  CL 89* 93* 92* 86*  CO2 37* 34* 35* 31  GLUCOSE 120* 131* 120* 98  BUN 11 8 9 13   CREATININE 0.73 0.75 0.65 0.61  CALCIUM 9.0 8.8* 9.1 8.6*  MG  --   --  1.9  --   PHOS  --   --  2.0*  --    Liver Function Tests: Recent Labs  Lab 05/21/24 0622  AST 23  ALT 10  ALKPHOS 51  BILITOT 0.5  PROT 5.8*  ALBUMIN  3.6   No results for input(s): LIPASE, AMYLASE in the last 168 hours. No results for input(s): AMMONIA in the last 168 hours. CBC: Recent Labs  Lab 05/21/24 0622 05/22/24 0545 05/23/24 0511 05/25/24 0240  WBC 9.9 12.1* 11.3* 11.9*  HGB 9.3* 8.0* 7.9* 8.0*  HCT 28.8* 24.5* 24.7* 24.7*  MCV 88.9 88.8 90.8 88.5  PLT 150 148* 150 180   Cardiac Enzymes: No results for input(s): CKTOTAL, CKMB, CKMBINDEX, TROPONINI in the last 168 hours. BNP: Invalid input(s): POCBNP CBG: No results for input(s): GLUCAP in the last 168 hours. D-Dimer No results for input(s): DDIMER in the last 72 hours. Hgb A1c No results for input(s): HGBA1C in the last 72 hours. Lipid Profile No results for input(s): CHOL, HDL, LDLCALC, TRIG, CHOLHDL, LDLDIRECT in the last 72 hours. Thyroid  function studies No results for input(s): TSH, T4TOTAL, T3FREE, THYROIDAB in  the last 72 hours.  Invalid input(s): FREET3 Anemia work up No results for input(s): VITAMINB12, FOLATE, FERRITIN, TIBC, IRON, RETICCTPCT in the last 72 hours. Urinalysis No results found for: COLORURINE, APPEARANCEUR, LABSPEC, PHURINE, GLUCOSEU, HGBUR, BILIRUBINUR, KETONESUR, PROTEINUR, UROBILINOGEN, NITRITE, LEUKOCYTESUR Sepsis Labs Recent Labs  Lab 05/21/24 0622 05/22/24 0545 05/23/24 0511 05/25/24 0240  WBC 9.9 12.1* 11.3* 11.9*   Microbiology Recent Results (from the past 240 hours)  Surgical pcr screen     Status: None   Collection Time: 05/21/24  6:11 AM   Specimen: Nasal Mucosa; Nasal Swab  Result Value Ref Range Status   MRSA, PCR NEGATIVE NEGATIVE Final   Staphylococcus aureus NEGATIVE NEGATIVE Final    Comment: (NOTE) The Xpert SA Assay (FDA approved for NASAL specimens in patients 58 years of age and older), is one component of a comprehensive surveillance program. It is not intended to diagnose infection nor to guide or monitor treatment. Performed at [facility], [address] [place], [place] [address]      Time coordinating discharge: Over 30 minutes  SIGNED:   Erle Odell Castor, MD  [facility] 05/27/2024, 7:20 AM Pager   If 7PM-7AM, please contact night-coverage [email or web address] Password [email or web address]     [1]  Allergies Allergen Reactions   Other Rash    Catgut sutures   Wound Dressing Adhesive Rash   Codeine     vomiting

## 2024-05-27 NOTE — Plan of Care (Signed)
" °  Problem: Education: Goal: Knowledge of General Education information will improve Description: Including pain rating scale, medication(s)/side effects and non-pharmacologic comfort measures Outcome: Adequate for Discharge   Problem: Health Behavior/Discharge Planning: Goal: Ability to manage health-related needs will improve 05/27/2024 0943 by Berkeley Verdie BRAVO, RN Outcome: Adequate for Discharge 05/27/2024 0843 by Berkeley Verdie BRAVO, RN Outcome: Progressing   Problem: Clinical Measurements: Goal: Ability to maintain clinical measurements within normal limits will improve Outcome: Adequate for Discharge Goal: Will remain free from infection Outcome: Adequate for Discharge Goal: Diagnostic test results will improve Outcome: Adequate for Discharge Goal: Respiratory complications will improve Outcome: Adequate for Discharge Goal: Cardiovascular complication will be avoided Outcome: Adequate for Discharge   Problem: Activity: Goal: Risk for activity intolerance will decrease 05/27/2024 0943 by Berkeley Verdie BRAVO, RN Outcome: Adequate for Discharge 05/27/2024 [phone] by Berkeley Verdie BRAVO, RN Outcome: Progressing   Problem: Nutrition: Goal: Adequate nutrition will be maintained Outcome: Adequate for Discharge   Problem: Coping: Goal: Level of anxiety will decrease Outcome: Adequate for Discharge   Problem: Elimination: Goal: Will not experience complications related to bowel motility 05/27/2024 0943 by Berkeley Verdie BRAVO, RN Outcome: Adequate for Discharge 05/27/2024 0843 by Berkeley Verdie BRAVO, RN Outcome: Progressing Goal: Will not experience complications related to urinary retention Outcome: Adequate for Discharge   Problem: Pain Managment: Goal: General experience of comfort will improve and/or be controlled Outcome: Adequate for Discharge   Problem: Safety: Goal: Ability to remain free from injury will improve Outcome: Adequate for Discharge   Problem: Skin Integrity: Goal: Risk for  impaired skin integrity will decrease Outcome: Adequate for Discharge   Problem: Education: Goal: Knowledge of the prescribed therapeutic regimen will improve Outcome: Adequate for Discharge   Problem: Bowel/Gastric: Goal: Gastrointestinal status for postoperative course will improve Outcome: Adequate for Discharge   Problem: Cardiac: Goal: Ability to maintain an adequate cardiac output Outcome: Adequate for Discharge Goal: Will show no evidence of cardiac arrhythmias Outcome: Adequate for Discharge   Problem: Nutritional: Goal: Will attain and maintain optimal nutritional status Outcome: Adequate for Discharge   Problem: Neurological: Goal: Will regain or maintain usual level of consciousness Outcome: Adequate for Discharge   Problem: Clinical Measurements: Goal: Ability to maintain clinical measurements within normal limits Outcome: Adequate for Discharge Goal: Postoperative complications will be avoided or minimized Outcome: Adequate for Discharge   Problem: Respiratory: Goal: Will regain and/or maintain adequate ventilation Outcome: Adequate for Discharge Goal: Respiratory status will improve Outcome: Adequate for Discharge   Problem: Skin Integrity: Goal: Demonstrates signs of wound healing without infection Outcome: Adequate for Discharge   Problem: Urinary Elimination: Goal: Will remain free from infection Outcome: Adequate for Discharge Goal: Ability to achieve and maintain adequate urine output Outcome: Adequate for Discharge   Problem: Education: Goal: Verbalization of understanding the information provided (i.e., activity precautions, restrictions, etc) will improve Outcome: Adequate for Discharge Goal: Individualized Educational Video(s) Outcome: Adequate for Discharge   Problem: Activity: Goal: Ability to ambulate and perform ADLs will improve Outcome: Adequate for Discharge   Problem: Clinical Measurements: Goal: Postoperative complications will  be avoided or minimized Outcome: Adequate for Discharge   Problem: Self-Concept: Goal: Ability to maintain and perform role responsibilities to the fullest extent possible will improve Outcome: Adequate for Discharge   Problem: Pain Management: Goal: Pain level will decrease Outcome: Adequate for Discharge   "

## 2024-05-27 NOTE — Progress Notes (Signed)
 Report called to [facility]; report given to [facility], LPN.  AVS placed in discharge packet for [facility]AR.
